# Patient Record
Sex: Female | Born: 1945 | Race: White | Hispanic: No | Marital: Married | Smoking: Former smoker
Health system: Southern US, Community
[De-identification: ages and names within clinical notes are randomized; demographics above are authoritative.]

## PROBLEM LIST (undated history)

## (undated) DIAGNOSIS — E785 Hyperlipidemia, unspecified: Secondary | ICD-10-CM

## (undated) DIAGNOSIS — I10 Essential (primary) hypertension: Secondary | ICD-10-CM

## (undated) DIAGNOSIS — M549 Dorsalgia, unspecified: Secondary | ICD-10-CM

## (undated) DIAGNOSIS — F419 Anxiety disorder, unspecified: Secondary | ICD-10-CM

## (undated) DIAGNOSIS — M1990 Unspecified osteoarthritis, unspecified site: Secondary | ICD-10-CM

## (undated) DIAGNOSIS — G8929 Other chronic pain: Secondary | ICD-10-CM

## (undated) DIAGNOSIS — H3530 Unspecified macular degeneration: Secondary | ICD-10-CM

## (undated) DIAGNOSIS — M542 Cervicalgia: Secondary | ICD-10-CM

## (undated) DIAGNOSIS — K219 Gastro-esophageal reflux disease without esophagitis: Secondary | ICD-10-CM

## (undated) DIAGNOSIS — K5790 Diverticulosis of intestine, part unspecified, without perforation or abscess without bleeding: Secondary | ICD-10-CM

## (undated) DIAGNOSIS — D126 Benign neoplasm of colon, unspecified: Secondary | ICD-10-CM

## (undated) DIAGNOSIS — E039 Hypothyroidism, unspecified: Secondary | ICD-10-CM

## (undated) DIAGNOSIS — R0602 Shortness of breath: Secondary | ICD-10-CM

## (undated) DIAGNOSIS — N61 Mastitis without abscess: Secondary | ICD-10-CM

## (undated) DIAGNOSIS — J189 Pneumonia, unspecified organism: Secondary | ICD-10-CM

## (undated) HISTORY — DX: Mastitis without abscess: N61.0

## (undated) HISTORY — DX: Diverticulosis of intestine, part unspecified, without perforation or abscess without bleeding: K57.90

## (undated) HISTORY — PX: TOTAL HIP ARTHROPLASTY: SHX124

## (undated) HISTORY — DX: Benign neoplasm of colon, unspecified: D12.6

## (undated) HISTORY — DX: Essential (primary) hypertension: I10

## (undated) HISTORY — DX: Hyperlipidemia, unspecified: E78.5

## (undated) HISTORY — PX: BACK SURGERY: SHX140

## (undated) HISTORY — PX: DILATION AND CURETTAGE OF UTERUS: SHX78

## (undated) HISTORY — PX: TUBAL LIGATION: SHX77

## (undated) HISTORY — DX: Hypothyroidism, unspecified: E03.9

---

## 1978-11-10 HISTORY — PX: BREAST BIOPSY: SHX20

## 2005-03-11 DIAGNOSIS — D126 Benign neoplasm of colon, unspecified: Secondary | ICD-10-CM

## 2005-03-11 HISTORY — DX: Benign neoplasm of colon, unspecified: D12.6

## 2008-01-04 ENCOUNTER — Encounter: Admission: RE | Admit: 2008-01-04 | Discharge: 2008-01-04 | Payer: Self-pay | Admitting: Otolaryngology

## 2008-12-19 ENCOUNTER — Encounter: Admission: RE | Admit: 2008-12-19 | Discharge: 2008-12-19 | Payer: Self-pay | Admitting: Family Medicine

## 2010-04-01 ENCOUNTER — Encounter: Payer: Self-pay | Admitting: Family Medicine

## 2010-06-28 ENCOUNTER — Ambulatory Visit
Admission: RE | Admit: 2010-06-28 | Discharge: 2010-06-28 | Disposition: A | Discharge status: Home / Self Care | Payer: No Typology Code available for payment source | Source: Ambulatory Visit | Attending: Neurosurgery | Admitting: Neurosurgery

## 2010-06-28 ENCOUNTER — Other Ambulatory Visit: Payer: Self-pay | Admitting: Neurosurgery

## 2010-06-28 DIAGNOSIS — M549 Dorsalgia, unspecified: Secondary | ICD-10-CM

## 2010-06-28 DIAGNOSIS — M4800 Spinal stenosis, site unspecified: Secondary | ICD-10-CM

## 2010-06-28 DIAGNOSIS — M5410 Radiculopathy, site unspecified: Secondary | ICD-10-CM

## 2010-07-23 ENCOUNTER — Other Ambulatory Visit: Payer: Self-pay | Admitting: Neurosurgery

## 2010-07-23 DIAGNOSIS — M549 Dorsalgia, unspecified: Secondary | ICD-10-CM

## 2010-07-23 DIAGNOSIS — M5410 Radiculopathy, site unspecified: Secondary | ICD-10-CM

## 2010-07-23 DIAGNOSIS — M48061 Spinal stenosis, lumbar region without neurogenic claudication: Secondary | ICD-10-CM

## 2010-07-24 ENCOUNTER — Ambulatory Visit
Admission: RE | Admit: 2010-07-24 | Discharge: 2010-07-24 | Disposition: A | Discharge status: Home / Self Care | Payer: No Typology Code available for payment source | Source: Ambulatory Visit | Attending: Neurosurgery | Admitting: Neurosurgery

## 2010-07-24 DIAGNOSIS — M549 Dorsalgia, unspecified: Secondary | ICD-10-CM

## 2010-07-24 DIAGNOSIS — M5410 Radiculopathy, site unspecified: Secondary | ICD-10-CM

## 2010-07-24 DIAGNOSIS — M48061 Spinal stenosis, lumbar region without neurogenic claudication: Secondary | ICD-10-CM

## 2010-09-03 ENCOUNTER — Other Ambulatory Visit: Payer: Self-pay | Admitting: Sports Medicine

## 2010-09-03 DIAGNOSIS — M549 Dorsalgia, unspecified: Secondary | ICD-10-CM

## 2010-09-03 DIAGNOSIS — M5410 Radiculopathy, site unspecified: Secondary | ICD-10-CM

## 2010-09-04 ENCOUNTER — Ambulatory Visit
Admission: RE | Admit: 2010-09-04 | Discharge: 2010-09-04 | Disposition: A | Discharge status: Home / Self Care | Payer: No Typology Code available for payment source | Source: Ambulatory Visit | Attending: Sports Medicine | Admitting: Sports Medicine

## 2010-09-04 DIAGNOSIS — M549 Dorsalgia, unspecified: Secondary | ICD-10-CM

## 2010-09-04 DIAGNOSIS — M5410 Radiculopathy, site unspecified: Secondary | ICD-10-CM

## 2010-10-09 ENCOUNTER — Other Ambulatory Visit: Payer: Self-pay | Admitting: Neurosurgery

## 2010-10-09 DIAGNOSIS — M542 Cervicalgia: Secondary | ICD-10-CM

## 2010-10-09 DIAGNOSIS — M5410 Radiculopathy, site unspecified: Secondary | ICD-10-CM

## 2010-10-09 DIAGNOSIS — M549 Dorsalgia, unspecified: Secondary | ICD-10-CM

## 2010-10-10 MED ORDER — DIAZEPAM 2 MG PO TABS: 10.0000 mg | ORAL_TABLET | Freq: Once | ORAL | Status: DC

## 2010-10-11 ENCOUNTER — Ambulatory Visit
Admission: RE | Admit: 2010-10-11 | Discharge: 2010-10-11 | Disposition: A | Discharge status: Home / Self Care | Payer: Medicare Other | Source: Ambulatory Visit | Attending: Neurosurgery | Admitting: Neurosurgery

## 2010-10-11 ENCOUNTER — Other Ambulatory Visit: Payer: No Typology Code available for payment source

## 2010-10-11 DIAGNOSIS — M549 Dorsalgia, unspecified: Secondary | ICD-10-CM

## 2010-10-11 DIAGNOSIS — M542 Cervicalgia: Secondary | ICD-10-CM

## 2010-10-11 DIAGNOSIS — M5410 Radiculopathy, site unspecified: Secondary | ICD-10-CM

## 2010-10-11 MED ORDER — IOHEXOL 300 MG/ML  SOLN
10.0000 mL | Freq: Once | INTRAMUSCULAR | Status: AC | PRN
  Administered 2010-10-11: 10 mL via INTRATHECAL

## 2010-10-11 MED ORDER — SODIUM CHLORIDE 0.9 % IV SOLN: 4.0000 mg | Freq: Four times a day (QID) | INTRAVENOUS | Status: DC | PRN

## 2010-10-11 MED ORDER — MEPERIDINE HCL 100 MG/ML IJ SOLN
75.0000 mg | Freq: Once | INTRAMUSCULAR | Status: AC
  Administered 2010-10-11: 75 mg via INTRAMUSCULAR

## 2010-10-11 MED ORDER — DIAZEPAM 2 MG PO TABS
5.0000 mg | ORAL_TABLET | Freq: Once | ORAL | Status: AC
  Administered 2010-10-11: 10 mg via ORAL

## 2010-10-11 MED ORDER — ONDANSETRON HCL 4 MG/2ML IJ SOLN
4.0000 mg | Freq: Once | INTRAMUSCULAR | Status: AC
  Administered 2010-10-11: 4 mg via INTRAMUSCULAR

## 2010-10-19 ENCOUNTER — Other Ambulatory Visit: Payer: Self-pay | Admitting: Neurosurgery

## 2010-10-19 DIAGNOSIS — Z780 Asymptomatic menopausal state: Secondary | ICD-10-CM

## 2010-10-23 ENCOUNTER — Ambulatory Visit
Admission: RE | Admit: 2010-10-23 | Discharge: 2010-10-23 | Disposition: A | Discharge status: Home / Self Care | Payer: Medicare Other | Source: Ambulatory Visit | Attending: Neurosurgery | Admitting: Neurosurgery

## 2010-10-23 DIAGNOSIS — Z780 Asymptomatic menopausal state: Secondary | ICD-10-CM

## 2010-12-27 ENCOUNTER — Encounter: Payer: Self-pay | Admitting: Gastroenterology

## 2010-12-27 ENCOUNTER — Ambulatory Visit (INDEPENDENT_AMBULATORY_CARE_PROVIDER_SITE_OTHER): Payer: Medicare Other | Admitting: Gastroenterology

## 2010-12-27 VITALS — BP 128/76 | HR 72 | Ht 64.0 in | Wt 157.2 lb

## 2010-12-27 DIAGNOSIS — Z860100 Personal history of colon polyps, unspecified: Secondary | ICD-10-CM | POA: Insufficient documentation

## 2010-12-27 DIAGNOSIS — K5900 Constipation, unspecified: Secondary | ICD-10-CM

## 2010-12-27 MED ORDER — PEG-KCL-NACL-NASULF-NA ASC-C 100 G PO SOLR: 1.0000 | Freq: Once | ORAL | Status: DC

## 2010-12-27 NOTE — Patient Instructions (Signed)
You have been scheduled for a Colonoscopy with propofol sedation.  See separate instructions.   Pick up your prep kit from your pharmacy.   cc: Zoe Lan, NP

## 2010-12-27 NOTE — Progress Notes (Signed)
History of Present Illness: This is a 65 year old female who complains of a mild chronic constipation she feels is related to her medications. She takes a fiber, laxative combination which is effective in controlling her constipation. She had colon polyps on colonoscopy in 2007 in [place] and was recommended for a 5 year followup colonoscopy. Apparently diverticulosis was also found. I do not have the procedure report or the pathology report available. Denies weight loss, abdominal pain, diarrhea, change in stool caliber, melena, hematochezia, nausea, vomiting, dysphagia, reflux symptoms, chest pain.  Past Medical History  Diagnosis Date  . Hyperlipidemia   . Hypertension   . Asthma   . Overactive bladder   . Osteoporosis   . Allergic rhinitis   . Hypothyroidism   . Diverticulosis   . Cellulitis of breast   . Adenomatous colon polyp 2007   Past Surgical History  Procedure Date  . Breast biopsy 1980's    Benign Right  . Total hip arthroplasty     x2    reports that she quit smoking about 19 years ago. She has never used smokeless tobacco. She reports that she does not drink alcohol or use illicit drugs. family history includes Cancer in her maternal grandmother; Diabetes in her son; Heart failure in her mother; Hyperlipidemia in her son; Hypertension in her brother; Kidney failure in her father; and Prostate cancer in her paternal grandfather. No Known Allergies   Review of Systems: Pertinent positive and negative review of systems were noted in the above HPI section. All other review of systems were otherwise negative.  Physical Exam: General: Well developed , well nourished, no acute distress Head: Normocephalic and atraumatic Eyes:  sclerae anicteric, EOMI Ears: Normal auditory acuity Mouth: No deformity or lesions Neck: Supple, no masses or thyromegaly Lungs: Clear throughout to auscultation Heart: Regular rate and rhythm; no murmurs, rubs or bruits Abdomen: Soft, non  tender and non distended. No masses, hepatosplenomegaly or hernias noted. Normal Bowel sounds Rectal: Deferred to colonoscopy Musculoskeletal: Symmetrical with no gross deformities  Skin: No lesions on visible extremities Pulses:  Normal pulses noted Extremities: No clubbing, cyanosis, edema or deformities noted Neurological: Alert oriented x 4, grossly nonfocal Cervical Nodes:  No significant cervical adenopathy Inguinal Nodes: No significant inguinal adenopathy Psychological:  Alert and cooperative. Normal mood and affect  Assessment and Recommendations:  1. Personal history of colon polyps. Presumably these were adenomatous based on prior surveillance recommendations. She is due for surveillance colonoscopy. The risks, benefits, and alternatives to colonoscopy with possible biopsy and possible polypectomy were discussed with the patient and they consent to proceed. Given her medications will proceed with propofol sedation.  2. Constipation. Continue current laxative and fiber regimen.

## 2010-12-31 ENCOUNTER — Telehealth: Payer: Self-pay | Admitting: Gastroenterology

## 2010-12-31 NOTE — Telephone Encounter (Signed)
Faxed release to try to get last Colonoscopy from Dr. Laveda Norman.

## 2011-01-29 ENCOUNTER — Ambulatory Visit (AMBULATORY_SURGERY_CENTER): Payer: Medicare Other | Admitting: Gastroenterology

## 2011-01-29 ENCOUNTER — Encounter: Payer: Self-pay | Admitting: Gastroenterology

## 2011-01-29 DIAGNOSIS — Z1211 Encounter for screening for malignant neoplasm of colon: Secondary | ICD-10-CM

## 2011-01-29 DIAGNOSIS — Z86010 Personal history of colonic polyps: Secondary | ICD-10-CM

## 2011-01-29 MED ORDER — SODIUM CHLORIDE 0.9 % IV SOLN: 500.0000 mL | INTRAVENOUS | Status: DC

## 2011-01-29 NOTE — Patient Instructions (Signed)
Please refer to the blue and neon green sheets for instructions regarding diet and activity for the rest of today.  Handouts on diverticulosis and a high fiber diet given. Resume previous medications. 

## 2011-01-29 NOTE — Progress Notes (Signed)
Pressure applied to abdomen to reach cecum.  

## 2011-01-29 NOTE — Progress Notes (Signed)
Patient did not experience any of the following events: a burn prior to discharge; a fall within the facility; wrong site/side/patient/procedure/implant event; or a hospital transfer or hospital admission upon discharge from the facility. (G8907) Patient did not have preoperative order for IV antibiotic SSI prophylaxis. (G8918)  

## 2011-01-30 ENCOUNTER — Telehealth: Payer: Self-pay

## 2011-01-30 NOTE — Telephone Encounter (Signed)

## 2011-04-26 ENCOUNTER — Ambulatory Visit
Admission: RE | Admit: 2011-04-26 | Discharge: 2011-04-26 | Disposition: A | Discharge status: Home / Self Care | Payer: Medicare Other | Source: Ambulatory Visit | Attending: Allergy and Immunology | Admitting: Allergy and Immunology

## 2011-04-26 ENCOUNTER — Other Ambulatory Visit: Payer: Self-pay | Admitting: Allergy and Immunology

## 2011-04-26 DIAGNOSIS — J329 Chronic sinusitis, unspecified: Secondary | ICD-10-CM

## 2011-04-26 DIAGNOSIS — R05 Cough: Secondary | ICD-10-CM

## 2012-02-25 ENCOUNTER — Other Ambulatory Visit: Payer: Self-pay | Admitting: Neurosurgery

## 2012-02-25 DIAGNOSIS — M549 Dorsalgia, unspecified: Secondary | ICD-10-CM

## 2012-02-25 DIAGNOSIS — M5410 Radiculopathy, site unspecified: Secondary | ICD-10-CM

## 2012-03-02 ENCOUNTER — Ambulatory Visit
Admission: RE | Admit: 2012-03-02 | Discharge: 2012-03-02 | Disposition: A | Discharge status: Home / Self Care | Payer: Medicare Other | Source: Ambulatory Visit | Attending: Neurosurgery | Admitting: Neurosurgery

## 2012-03-02 VITALS — BP 140/75 | HR 58

## 2012-03-02 DIAGNOSIS — M549 Dorsalgia, unspecified: Secondary | ICD-10-CM

## 2012-03-02 DIAGNOSIS — M5410 Radiculopathy, site unspecified: Secondary | ICD-10-CM

## 2012-03-02 MED ORDER — IOHEXOL 180 MG/ML  SOLN
15.0000 mL | Freq: Once | INTRAMUSCULAR | Status: AC | PRN
  Administered 2012-03-02: 15 mL via INTRATHECAL

## 2012-03-02 MED ORDER — HYDROCODONE-ACETAMINOPHEN 5-325 MG PO TABS
1.0000 | ORAL_TABLET | Freq: Once | ORAL | Status: AC
  Administered 2012-03-02: 1 via ORAL

## 2012-03-02 MED ORDER — DIAZEPAM 5 MG PO TABS
5.0000 mg | ORAL_TABLET | Freq: Once | ORAL | Status: AC
  Administered 2012-03-02: 5 mg via ORAL

## 2012-03-02 NOTE — Progress Notes (Signed)
Pt states she has been off cymbalta for the past 2 days.

## 2012-03-12 ENCOUNTER — Other Ambulatory Visit: Payer: Self-pay | Admitting: Neurosurgery

## 2012-03-27 ENCOUNTER — Encounter (HOSPITAL_COMMUNITY): Payer: Self-pay

## 2012-03-30 ENCOUNTER — Encounter (HOSPITAL_COMMUNITY)
Admission: RE | Admit: 2012-03-30 | Discharge: 2012-03-30 | Disposition: A | Discharge status: Home / Self Care | Payer: Medicare Other | Source: Ambulatory Visit | Attending: Neurosurgery | Admitting: Neurosurgery

## 2012-03-30 ENCOUNTER — Encounter (HOSPITAL_COMMUNITY): Payer: Self-pay

## 2012-03-30 HISTORY — DX: Cervicalgia: M54.2

## 2012-03-30 HISTORY — DX: Unspecified macular degeneration: H35.30

## 2012-03-30 HISTORY — DX: Anxiety disorder, unspecified: F41.9

## 2012-03-30 HISTORY — DX: Unspecified osteoarthritis, unspecified site: M19.90

## 2012-03-30 HISTORY — DX: Other chronic pain: G89.29

## 2012-03-30 HISTORY — DX: Dorsalgia, unspecified: M54.9

## 2012-03-30 HISTORY — DX: Pneumonia, unspecified organism: J18.9

## 2012-03-30 LAB — BASIC METABOLIC PANEL
CO2: 30 mEq/L (ref 19–32)
Calcium: 9.9 mg/dL (ref 8.4–10.5)
GFR calc Af Amer: >90 mL/min (ref >90)
GFR calc non Af Amer: 89 mL/min — ABNORMAL LOW (ref >90)
Sodium: 136 mEq/L (ref 135–145)

## 2012-03-30 LAB — CBC
Platelets: 235 10*3/uL (ref 150–400)
RBC: 4.74 MIL/uL (ref 3.87–5.11)
WBC: 7 10*3/uL (ref 4.0–10.5)

## 2012-03-30 LAB — TYPE AND SCREEN
ABO/RH(D): A POS
Antibody Screen: NEGATIVE

## 2012-03-30 LAB — SURGICAL PCR SCREEN: MRSA, PCR: NEGATIVE

## 2012-03-30 NOTE — Pre-Procedure Instructions (Signed)
Shelby Bowman  03/30/2012   Your procedure is scheduled on:  Tuesday April 07, 2012  Report to [facility] at 5:30 AM.  Call this number if you have problems the morning of surgery: [phone]   Remember:   Do not eat food or drink liquids after midnight.   Take these medicines the morning of surgery with A SIP OF WATER: albuterol, fosamaxk, qvar, cymbalta, fentanyl patch, flonase, synthroid, dulera, singulair, protonix   Do not wear jewelry, make-up or nail polish.  Do not wear lotions, powders, or perfumes.   Do not shave 48 hours prior to surgery.  Do not bring valuables to the hospital.  Contacts, dentures or bridgework may not be worn into surgery.  Leave suitcase in the car. After surgery it may be brought to your room.  For patients admitted to the hospital, checkout time is 11:00 AM the day of  discharge.   Patients discharged the day of surgery will not be allowed to drive  home.  Name and phone number of your driver: family / friend  Special Instructions: Shower using CHG 2 nights before surgery and the night before surgery.  If you shower the day of surgery use CHG.  Use special wash - you have one bottle of CHG for all showers.  You should use approximately 1/3 of the bottle for each shower.   Please read over the following fact sheets that you were given: Pain Booklet, Coughing and Deep Breathing, Blood Transfusion Information, MRSA Information and Surgical Site Infection Prevention

## 2012-04-06 MED ORDER — CEFAZOLIN SODIUM-DEXTROSE 2-3 GM-% IV SOLR
2.0000 g | INTRAVENOUS | Status: AC
  Administered 2012-04-07: 2 g via INTRAVENOUS
  Filled 2012-04-06: qty 50

## 2012-04-07 ENCOUNTER — Inpatient Hospital Stay (HOSPITAL_COMMUNITY): Payer: Medicare Other

## 2012-04-07 ENCOUNTER — Inpatient Hospital Stay (HOSPITAL_COMMUNITY): Payer: Medicare Other | Admitting: Certified Registered"

## 2012-04-07 ENCOUNTER — Encounter (HOSPITAL_COMMUNITY): Payer: Self-pay | Admitting: Surgery

## 2012-04-07 ENCOUNTER — Encounter (HOSPITAL_COMMUNITY): Payer: Self-pay | Admitting: *Deleted

## 2012-04-07 ENCOUNTER — Inpatient Hospital Stay (HOSPITAL_COMMUNITY)
Admission: RE | Admit: 2012-04-07 | Discharge: 2012-04-15 | DRG: 458 | Disposition: A | Discharge status: Home / Self Care | Payer: Medicare Other | Source: Ambulatory Visit | Attending: Neurosurgery | Admitting: Neurosurgery

## 2012-04-07 ENCOUNTER — Encounter (HOSPITAL_COMMUNITY)
Admission: RE | Disposition: A | Discharge status: Home / Self Care | Payer: Self-pay | Source: Ambulatory Visit | Attending: Neurosurgery

## 2012-04-07 ENCOUNTER — Encounter (HOSPITAL_COMMUNITY): Payer: Self-pay | Admitting: Certified Registered"

## 2012-04-07 DIAGNOSIS — I10 Essential (primary) hypertension: Secondary | ICD-10-CM | POA: Diagnosis present

## 2012-04-07 DIAGNOSIS — M4120 Other idiopathic scoliosis, site unspecified: Principal | ICD-10-CM | POA: Diagnosis present

## 2012-04-07 DIAGNOSIS — M51379 Other intervertebral disc degeneration, lumbosacral region without mention of lumbar back pain or lower extremity pain: Secondary | ICD-10-CM | POA: Diagnosis present

## 2012-04-07 DIAGNOSIS — F411 Generalized anxiety disorder: Secondary | ICD-10-CM | POA: Diagnosis present

## 2012-04-07 DIAGNOSIS — E039 Hypothyroidism, unspecified: Secondary | ICD-10-CM | POA: Diagnosis present

## 2012-04-07 DIAGNOSIS — M5137 Other intervertebral disc degeneration, lumbosacral region: Secondary | ICD-10-CM | POA: Diagnosis present

## 2012-04-07 DIAGNOSIS — Z87891 Personal history of nicotine dependence: Secondary | ICD-10-CM

## 2012-04-07 DIAGNOSIS — J45909 Unspecified asthma, uncomplicated: Secondary | ICD-10-CM | POA: Diagnosis present

## 2012-04-07 DIAGNOSIS — M419 Scoliosis, unspecified: Secondary | ICD-10-CM | POA: Diagnosis present

## 2012-04-07 DIAGNOSIS — K5900 Constipation, unspecified: Secondary | ICD-10-CM | POA: Diagnosis present

## 2012-04-07 HISTORY — PX: POSTERIOR LUMBAR FUSION 4 LEVEL: SHX6037

## 2012-04-07 SURGERY — POSTERIOR LUMBAR FUSION 4 LEVEL
Anesthesia: General | Site: Back | Wound class: Clean

## 2012-04-07 MED ORDER — THROMBIN 20000 UNITS EX SOLR
CUTANEOUS | Status: DC | PRN
  Administered 2012-04-07: 15:00:00 via TOPICAL

## 2012-04-07 MED ORDER — DULOXETINE HCL 30 MG PO CPEP
30.0000 mg | ORAL_CAPSULE | Freq: Every day | ORAL | Status: DC
  Administered 2012-04-08 – 2012-04-15 (×8): 30 mg via ORAL
  Filled 2012-04-07 (×8): qty 1

## 2012-04-07 MED ORDER — ACETAMINOPHEN 650 MG RE SUPP: 650.0000 mg | RECTAL | Status: DC | PRN

## 2012-04-07 MED ORDER — MONTELUKAST SODIUM 10 MG PO TABS
10.0000 mg | ORAL_TABLET | Freq: Every day | ORAL | Status: DC
  Administered 2012-04-07 – 2012-04-14 (×7): 10 mg via ORAL
  Filled 2012-04-07 (×10): qty 1

## 2012-04-07 MED ORDER — SODIUM CHLORIDE 0.9 % IV SOLN
INTRAVENOUS | Status: DC | PRN
  Administered 2012-04-07: 17:00:00 via INTRAVENOUS

## 2012-04-07 MED ORDER — ROCURONIUM BROMIDE 100 MG/10ML IV SOLN
INTRAVENOUS | Status: DC | PRN
  Administered 2012-04-07 (×4): 10 mg via INTRAVENOUS
  Administered 2012-04-07: 50 mg via INTRAVENOUS

## 2012-04-07 MED ORDER — DIPHENHYDRAMINE HCL 50 MG/ML IJ SOLN: 12.5000 mg | Freq: Four times a day (QID) | INTRAMUSCULAR | Status: DC | PRN

## 2012-04-07 MED ORDER — MOMETASONE FURO-FORMOTEROL FUM 200-5 MCG/ACT IN AERO
2.0000 | INHALATION_SPRAY | Freq: Two times a day (BID) | RESPIRATORY_TRACT | Status: DC
  Administered 2012-04-08 – 2012-04-15 (×13): 2 via RESPIRATORY_TRACT
  Filled 2012-04-07 (×3): qty 8.8

## 2012-04-07 MED ORDER — FENTANYL CITRATE 0.05 MG/ML IJ SOLN
INTRAMUSCULAR | Status: DC | PRN
  Administered 2012-04-07 (×3): 100 ug via INTRAVENOUS
  Administered 2012-04-07: 50 ug via INTRAVENOUS
  Administered 2012-04-07: 100 ug via INTRAVENOUS
  Administered 2012-04-07: 50 ug via INTRAVENOUS

## 2012-04-07 MED ORDER — LOSARTAN POTASSIUM-HCTZ 50-12.5 MG PO TABS: 1.0000 | ORAL_TABLET | Freq: Every day | ORAL | Status: DC

## 2012-04-07 MED ORDER — MIDAZOLAM HCL 5 MG/5ML IJ SOLN
INTRAMUSCULAR | Status: DC | PRN
  Administered 2012-04-07: 2 mg via INTRAVENOUS

## 2012-04-07 MED ORDER — BUPIVACAINE LIPOSOME 1.3 % IJ SUSP
20.0000 mL | INTRAMUSCULAR | Status: AC
  Filled 2012-04-07: qty 20

## 2012-04-07 MED ORDER — SODIUM CHLORIDE 0.9 % IJ SOLN: 9.0000 mL | INTRAMUSCULAR | Status: DC | PRN

## 2012-04-07 MED ORDER — OXYCODONE-ACETAMINOPHEN 5-325 MG PO TABS
1.0000 | ORAL_TABLET | ORAL | Status: DC | PRN
  Administered 2012-04-09 – 2012-04-10 (×2): 2 via ORAL
  Filled 2012-04-07 (×2): qty 2

## 2012-04-07 MED ORDER — ONDANSETRON HCL 4 MG/2ML IJ SOLN: 4.0000 mg | Freq: Four times a day (QID) | INTRAMUSCULAR | Status: DC | PRN

## 2012-04-07 MED ORDER — CEFAZOLIN SODIUM 1-5 GM-% IV SOLN
1.0000 g | Freq: Three times a day (TID) | INTRAVENOUS | Status: AC
  Administered 2012-04-07 – 2012-04-08 (×2): 1 g via INTRAVENOUS
  Filled 2012-04-07 (×2): qty 50

## 2012-04-07 MED ORDER — ALENDRONATE SODIUM 70 MG PO TABS: 70.0000 mg | ORAL_TABLET | ORAL | Status: DC

## 2012-04-07 MED ORDER — SODIUM CHLORIDE 0.9 % IJ SOLN
3.0000 mL | Freq: Two times a day (BID) | INTRAMUSCULAR | Status: DC
  Administered 2012-04-08 – 2012-04-12 (×8): 3 mL via INTRAVENOUS

## 2012-04-07 MED ORDER — LIDOCAINE HCL (CARDIAC) 20 MG/ML IV SOLN
INTRAVENOUS | Status: DC | PRN
  Administered 2012-04-07: 70 mg via INTRAVENOUS

## 2012-04-07 MED ORDER — PROPOFOL 10 MG/ML IV BOLUS
INTRAVENOUS | Status: DC | PRN
  Administered 2012-04-07: 125 mg via INTRAVENOUS

## 2012-04-07 MED ORDER — LACTATED RINGERS IV SOLN
INTRAVENOUS | Status: DC | PRN
  Administered 2012-04-07 (×4): via INTRAVENOUS

## 2012-04-07 MED ORDER — EPHEDRINE SULFATE 50 MG/ML IJ SOLN
INTRAMUSCULAR | Status: DC | PRN
  Administered 2012-04-07 (×2): 5 mg via INTRAVENOUS
  Administered 2012-04-07: 10 mg via INTRAVENOUS
  Administered 2012-04-07 (×6): 5 mg via INTRAVENOUS

## 2012-04-07 MED ORDER — OXYCODONE HCL 5 MG/5ML PO SOLN: 5.0000 mg | Freq: Once | ORAL | Status: DC | PRN

## 2012-04-07 MED ORDER — LORATADINE 10 MG PO TABS
10.0000 mg | ORAL_TABLET | Freq: Every day | ORAL | Status: DC
  Administered 2012-04-07 – 2012-04-15 (×9): 10 mg via ORAL
  Filled 2012-04-07 (×9): qty 1

## 2012-04-07 MED ORDER — HYDROMORPHONE HCL PF 1 MG/ML IJ SOLN
INTRAMUSCULAR | Status: AC
  Filled 2012-04-07: qty 1

## 2012-04-07 MED ORDER — SODIUM CHLORIDE 0.9 % IJ SOLN: 3.0000 mL | INTRAMUSCULAR | Status: DC | PRN

## 2012-04-07 MED ORDER — LOSARTAN POTASSIUM 50 MG PO TABS
50.0000 mg | ORAL_TABLET | Freq: Every day | ORAL | Status: DC
  Administered 2012-04-07 – 2012-04-15 (×7): 50 mg via ORAL
  Filled 2012-04-07 (×9): qty 1

## 2012-04-07 MED ORDER — BUPIVACAINE LIPOSOME 1.3 % IJ SUSP
INTRAMUSCULAR | Status: DC | PRN
  Administered 2012-04-07: 20 mL

## 2012-04-07 MED ORDER — SODIUM CHLORIDE 0.9 % IV SOLN: 250.0000 mL | INTRAVENOUS | Status: DC

## 2012-04-07 MED ORDER — ONDANSETRON HCL 4 MG/2ML IJ SOLN
4.0000 mg | INTRAMUSCULAR | Status: DC | PRN
  Administered 2012-04-08: 4 mg via INTRAVENOUS
  Filled 2012-04-07: qty 2

## 2012-04-07 MED ORDER — NALOXONE HCL 0.4 MG/ML IJ SOLN: 0.4000 mg | INTRAMUSCULAR | Status: DC | PRN

## 2012-04-07 MED ORDER — LEVOTHYROXINE SODIUM 75 MCG PO TABS
75.0000 ug | ORAL_TABLET | Freq: Every day | ORAL | Status: DC
  Administered 2012-04-08 – 2012-04-15 (×8): 75 ug via ORAL
  Filled 2012-04-07 (×10): qty 1

## 2012-04-07 MED ORDER — MORPHINE SULFATE (PF) 1 MG/ML IV SOLN
INTRAVENOUS | Status: DC
  Administered 2012-04-07: 21:00:00 via INTRAVENOUS
  Administered 2012-04-08: 4.5 mg via INTRAVENOUS
  Administered 2012-04-08: 15.2 mg via INTRAVENOUS
  Administered 2012-04-08: 20.5 mg via INTRAVENOUS
  Administered 2012-04-08: 13.5 mg via INTRAVENOUS
  Administered 2012-04-08: 12:00:00 via INTRAVENOUS
  Administered 2012-04-08: 10.5 mg via INTRAVENOUS
  Administered 2012-04-09: 1.5 mg via INTRAVENOUS
  Administered 2012-04-09: 5.61 mg via INTRAVENOUS
  Administered 2012-04-09: 1.5 mg via INTRAVENOUS
  Administered 2012-04-09: 9 mg via INTRAVENOUS
  Administered 2012-04-10: 1.5 mg via INTRAVENOUS
  Administered 2012-04-10: 11:00:00 via INTRAVENOUS
  Administered 2012-04-10: 1.5 mg via INTRAVENOUS
  Administered 2012-04-10: 3 mg via INTRAVENOUS
  Filled 2012-04-07 (×4): qty 25

## 2012-04-07 MED ORDER — ALBUMIN HUMAN 5 % IV SOLN
INTRAVENOUS | Status: DC | PRN
  Administered 2012-04-07 (×2): via INTRAVENOUS

## 2012-04-07 MED ORDER — HYDROMORPHONE HCL PF 1 MG/ML IJ SOLN
0.2500 mg | INTRAMUSCULAR | Status: DC | PRN
  Administered 2012-04-07 (×4): 0.5 mg via INTRAVENOUS

## 2012-04-07 MED ORDER — ONDANSETRON HCL 4 MG/2ML IJ SOLN
INTRAMUSCULAR | Status: DC | PRN
  Administered 2012-04-07: 4 mg via INTRAVENOUS

## 2012-04-07 MED ORDER — LEVOCETIRIZINE DIHYDROCHLORIDE 5 MG PO TABS: 5.0000 mg | ORAL_TABLET | Freq: Every evening | ORAL | Status: DC

## 2012-04-07 MED ORDER — DIPHENHYDRAMINE HCL 12.5 MG/5ML PO ELIX: 12.5000 mg | ORAL_SOLUTION | Freq: Four times a day (QID) | ORAL | Status: DC | PRN

## 2012-04-07 MED ORDER — LABETALOL HCL 5 MG/ML IV SOLN
INTRAVENOUS | Status: DC | PRN
  Administered 2012-04-07 (×4): 5 mg via INTRAVENOUS

## 2012-04-07 MED ORDER — PHENYLEPHRINE HCL 10 MG/ML IJ SOLN
INTRAMUSCULAR | Status: DC | PRN
  Administered 2012-04-07: 40 ug via INTRAVENOUS
  Administered 2012-04-07: 80 ug via INTRAVENOUS
  Administered 2012-04-07 (×2): 40 ug via INTRAVENOUS
  Administered 2012-04-07 (×3): 80 ug via INTRAVENOUS
  Administered 2012-04-07 (×2): 40 ug via INTRAVENOUS

## 2012-04-07 MED ORDER — MENTHOL 3 MG MT LOZG: 1.0000 | LOZENGE | OROMUCOSAL | Status: DC | PRN

## 2012-04-07 MED ORDER — MORPHINE SULFATE (PF) 1 MG/ML IV SOLN
INTRAVENOUS | Status: AC
  Administered 2012-04-08: 25 mg
  Filled 2012-04-07: qty 25

## 2012-04-07 MED ORDER — 0.9 % SODIUM CHLORIDE (POUR BTL) OPTIME
TOPICAL | Status: DC | PRN
  Administered 2012-04-07: 1000 mL

## 2012-04-07 MED ORDER — ACETAMINOPHEN 325 MG PO TABS
650.0000 mg | ORAL_TABLET | ORAL | Status: DC | PRN
  Administered 2012-04-10: 650 mg via ORAL
  Filled 2012-04-07: qty 2

## 2012-04-07 MED ORDER — NEOSTIGMINE METHYLSULFATE 1 MG/ML IJ SOLN
INTRAMUSCULAR | Status: DC | PRN
  Administered 2012-04-07: 4 mg via INTRAVENOUS

## 2012-04-07 MED ORDER — OXYCODONE HCL 5 MG PO TABS: 5.0000 mg | ORAL_TABLET | Freq: Once | ORAL | Status: DC | PRN

## 2012-04-07 MED ORDER — SIMVASTATIN 5 MG PO TABS
5.0000 mg | ORAL_TABLET | Freq: Every day | ORAL | Status: DC
  Administered 2012-04-07 – 2012-04-14 (×8): 5 mg via ORAL
  Filled 2012-04-07 (×9): qty 1

## 2012-04-07 MED ORDER — HEMOSTATIC AGENTS (NO CHARGE) OPTIME
TOPICAL | Status: DC | PRN
  Administered 2012-04-07: 1 via TOPICAL

## 2012-04-07 MED ORDER — DIAZEPAM 5 MG PO TABS
5.0000 mg | ORAL_TABLET | Freq: Four times a day (QID) | ORAL | Status: DC | PRN
  Administered 2012-04-08 – 2012-04-12 (×8): 5 mg via ORAL
  Filled 2012-04-07 (×8): qty 1

## 2012-04-07 MED ORDER — SODIUM CHLORIDE 0.9 % IV SOLN: INTRAVENOUS | Status: DC

## 2012-04-07 MED ORDER — PHENOL 1.4 % MT LIQD: 1.0000 | OROMUCOSAL | Status: DC | PRN

## 2012-04-07 MED ORDER — HYDROCHLOROTHIAZIDE 12.5 MG PO CAPS
12.5000 mg | ORAL_CAPSULE | Freq: Every day | ORAL | Status: DC
  Administered 2012-04-07 – 2012-04-15 (×6): 12.5 mg via ORAL
  Filled 2012-04-07 (×9): qty 1

## 2012-04-07 MED ORDER — GLYCOPYRROLATE 0.2 MG/ML IJ SOLN
INTRAMUSCULAR | Status: DC | PRN
  Administered 2012-04-07: .6 mg via INTRAVENOUS

## 2012-04-07 MED ORDER — ZOLPIDEM TARTRATE 5 MG PO TABS
5.0000 mg | ORAL_TABLET | Freq: Every evening | ORAL | Status: DC | PRN
  Administered 2012-04-13 – 2012-04-15 (×2): 5 mg via ORAL
  Filled 2012-04-07 (×2): qty 1

## 2012-04-07 MED ORDER — HYDROCHLOROTHIAZIDE 25 MG PO TABS
25.0000 mg | ORAL_TABLET | Freq: Every day | ORAL | Status: DC
  Administered 2012-04-07 – 2012-04-15 (×8): 25 mg via ORAL
  Filled 2012-04-07 (×9): qty 1

## 2012-04-07 SURGICAL SUPPLY — 70 items
BENZOIN TINCTURE PRP APPL 2/3 (GAUZE/BANDAGES/DRESSINGS) ×2 IMPLANT
BLADE SURG ROTATE 9660 (MISCELLANEOUS) IMPLANT
BONE EQUIVA 10CC (Bone Implant) ×2 IMPLANT
BUR ACORN 6.0 (BURR) ×2 IMPLANT
BUR MATCHSTICK NEURO 3.0 LAGG (BURR) ×4 IMPLANT
CANISTER SUCTION 2500CC (MISCELLANEOUS) ×2 IMPLANT
CAP REVERE LOCKING (Cap) ×20 IMPLANT
CLOTH BEACON ORANGE TIMEOUT ST (SAFETY) ×2 IMPLANT
CONNECTOR REVERE 6.35 32-40MM (Connector) ×2 IMPLANT
CONT SPEC 4OZ CLIKSEAL STRL BL (MISCELLANEOUS) ×4 IMPLANT
COVER BACK TABLE 24X17X13 BIG (DRAPES) IMPLANT
COVER TABLE BACK 60X90 (DRAPES) ×2 IMPLANT
DRAPE C-ARM 42X72 X-RAY (DRAPES) ×4 IMPLANT
DRAPE LAPAROTOMY 100X72X124 (DRAPES) ×2 IMPLANT
DRAPE POUCH INSTRU U-SHP 10X18 (DRAPES) ×2 IMPLANT
DRAPE PROXIMA HALF (DRAPES) ×2 IMPLANT
DRSG PAD ABDOMINAL 8X10 ST (GAUZE/BANDAGES/DRESSINGS) IMPLANT
DURAPREP 26ML APPLICATOR (WOUND CARE) ×2 IMPLANT
ELECT REM PT RETURN 9FT ADLT (ELECTROSURGICAL) ×2
ELECTRODE REM PT RTRN 9FT ADLT (ELECTROSURGICAL) ×1 IMPLANT
EVACUATOR 1/8 PVC DRAIN (DRAIN) IMPLANT
EVACUATOR 3/16  PVC DRAIN (DRAIN) ×1
EVACUATOR 3/16 PVC DRAIN (DRAIN) ×1 IMPLANT
GAUZE SPONGE 4X4 16PLY XRAY LF (GAUZE/BANDAGES/DRESSINGS) ×2 IMPLANT
GLOVE BIO SURGEON STRL SZ 6.5 (GLOVE) ×10 IMPLANT
GLOVE BIOGEL M 8.0 STRL (GLOVE) ×6 IMPLANT
GLOVE BIOGEL PI IND STRL 7.0 (GLOVE) ×1 IMPLANT
GLOVE BIOGEL PI INDICATOR 7.0 (GLOVE) ×1
GLOVE ECLIPSE 7.5 STRL STRAW (GLOVE) ×2 IMPLANT
GLOVE EXAM NITRILE LRG STRL (GLOVE) IMPLANT
GLOVE EXAM NITRILE MD LF STRL (GLOVE) ×2 IMPLANT
GLOVE EXAM NITRILE XL STR (GLOVE) IMPLANT
GLOVE EXAM NITRILE XS STR PU (GLOVE) IMPLANT
GOWN BRE IMP SLV AUR LG STRL (GOWN DISPOSABLE) ×8 IMPLANT
GOWN BRE IMP SLV AUR XL STRL (GOWN DISPOSABLE) IMPLANT
GOWN STRL REIN 2XL LVL4 (GOWN DISPOSABLE) ×2 IMPLANT
KIT BASIN OR (CUSTOM PROCEDURE TRAY) ×2 IMPLANT
KIT ROOM TURNOVER OR (KITS) ×2 IMPLANT
MILL MEDIUM DISP (BLADE) ×2 IMPLANT
NEEDLE HYPO 18GX1.5 BLUNT FILL (NEEDLE) IMPLANT
NEEDLE HYPO 21X1.5 SAFETY (NEEDLE) ×2 IMPLANT
NEEDLE HYPO 25X1 1.5 SAFETY (NEEDLE) ×2 IMPLANT
NS IRRIG 1000ML POUR BTL (IV SOLUTION) ×2 IMPLANT
PACK LAMINECTOMY NEURO (CUSTOM PROCEDURE TRAY) ×2 IMPLANT
PAD ARMBOARD 7.5X6 YLW CONV (MISCELLANEOUS) ×6 IMPLANT
PATTIES SURGICAL .5 X1 (DISPOSABLE) ×2 IMPLANT
PATTIES SURGICAL .5 X3 (DISPOSABLE) IMPLANT
PATTIES SURGICAL 1X1 (DISPOSABLE) ×2 IMPLANT
ROD REVERSE 6.35 STRAIGHT 75MM (Rod) ×4 IMPLANT
SCREW REVERE 5.5X45 (Screw) ×4 IMPLANT
SCREW REVERE 6.35 5.5X40MM (Screw) ×16 IMPLANT
SPACER SUSTAIN O SML 10X22 12M (Spacer) ×2 IMPLANT
SPONGE GAUZE 4X4 12PLY (GAUZE/BANDAGES/DRESSINGS) ×2 IMPLANT
SPONGE LAP 4X18 X RAY DECT (DISPOSABLE) IMPLANT
SPONGE NEURO XRAY DETECT 1X3 (DISPOSABLE) IMPLANT
SPONGE SURGIFOAM ABS GEL 100 (HEMOSTASIS) ×2 IMPLANT
SPONGE SURGIFOAM ABS GEL SZ50 (HEMOSTASIS) ×2 IMPLANT
STRIP CLOSURE SKIN 1/2X4 (GAUZE/BANDAGES/DRESSINGS) ×2 IMPLANT
SUT VIC AB 1 CT1 18XBRD ANBCTR (SUTURE) ×1 IMPLANT
SUT VIC AB 1 CT1 8-18 (SUTURE) ×1
SUT VIC AB 2-0 CP2 18 (SUTURE) ×2 IMPLANT
SUT VIC AB 3-0 SH 8-18 (SUTURE) ×2 IMPLANT
SYR 20CC LL (SYRINGE) ×2 IMPLANT
SYR 20ML ECCENTRIC (SYRINGE) ×2 IMPLANT
SYR 5ML LL (SYRINGE) IMPLANT
TAPE CLOTH SURG 4X10 WHT LF (GAUZE/BANDAGES/DRESSINGS) ×2 IMPLANT
TOWEL OR 17X24 6PK STRL BLUE (TOWEL DISPOSABLE) ×2 IMPLANT
TOWEL OR 17X26 10 PK STRL BLUE (TOWEL DISPOSABLE) ×2 IMPLANT
TRAY FOLEY CATH 14FRSI W/METER (CATHETERS) ×2 IMPLANT
WATER STERILE IRR 1000ML POUR (IV SOLUTION) ×2 IMPLANT

## 2012-04-07 NOTE — Progress Notes (Signed)
PHARMACIST - PHYSICIAN COMMUNICATION  CONCERNING: P&T Medication Policy Regarding Oral Bisphosphonates  RECOMMENDATION: Your order for alendronate (Fosamax), ibandronate (Boniva), or risedronate (Actonel) has been discontinued at this time.  If the patient's post-hospital medical condition warrants safe use of this class of drugs, please resume the pre-hospital regimen upon discharge.  DESCRIPTION:  Alendronate (Fosamax), ibandronate (Boniva), and risedronate (Actonel) can cause severe esophageal erosions in patients who are unable to remain upright at least 30 minutes after taking this medication.   Since brief interruptions in therapy are thought to have minimal impact on bone mineral density, the Pharmacy & Therapeutics Committee has established that bisphosphonate orders should be routinely discontinued during hospitalization.   To override this safety policy and permit administration of Boniva, Fosamax, or Actonel in the hospital, prescribers must write "DO NOT HOLD" in the comments section when placing the order for this class of medications.  Christoper Fabian, PharmD, BCPS Clinical pharmacist, pager [phone] 04/07/2012  9:03 PM

## 2012-04-07 NOTE — H&P (Signed)
Shelby Bowman is an 67 y.o. female.   Chief Complaint: lbp HPI: patient who came to my office with her husband complaining of lbp with radiation to both lower extremities which gets better with lumbar flexion and when she walks she needs to stop frequently secondary to leg cramps. She has failed with every conservative treatment .  Past Medical History  Diagnosis Date  . Hyperlipidemia   . Osteoporosis   . Allergic rhinitis     "states on protonix for allegices per allergist"  . Hypothyroidism   . Diverticulosis   . Cellulitis of breast   . Adenomatous colon polyp 2007  . Hypertension     sees Zoe Lan, pa  . Anxiety   . Asthma     sees Dr. Lucie Leather  . Pneumonia     hx of, and hx of bronchitis  . Arthritis   . Macular degeneration     left eye  . Chronic neck pain     hx of  . Chronic back pain     hx of    Past Surgical History  Procedure Date  . Breast biopsy 1980's    Benign Right  . Total hip arthroplasty     x2    Family History  Problem Relation Age of Onset  . Hyperlipidemia Son   . Diabetes Son   . Cancer Maternal Grandmother   . Hypertension Brother   . Kidney failure Father   . Prostate cancer Paternal Grandfather   . Heart failure Mother   . Colon cancer Neg Hx    Social History:  reports that she quit smoking about 21 years ago. She has never used smokeless tobacco. She reports that she does not drink alcohol or use illicit drugs.  Allergies:  Allergies  Allergen Reactions  . Other Dermatitis    SILK  TAPE . PATIENT HAD A BLISTER TYPE RASH.     Medications Prior to Admission  Medication Sig Dispense Refill  . albuterol (PROVENTIL) (2.5 MG/3ML) 0.083% nebulizer solution Take 2.5 mg by nebulization every 6 (six) hours as needed. For shortness of breath/wheezing      . alendronate (FOSAMAX) 70 MG tablet Take 70 mg by mouth every 7 (seven) days. Take with a full glass of water on an empty stomach. On Monday      . beclomethasone (QVAR) 80  MCG/ACT inhaler Inhale 2 puffs into the lungs 2 (two) times daily.      . DULoxetine (CYMBALTA) 30 MG capsule Take 30 mg by mouth daily.        . fentaNYL (DURAGESIC - DOSED MCG/HR) 12 MCG/HR Place 1 patch onto the skin every 3 (three) days.      . fluticasone (FLONASE) 50 MCG/ACT nasal spray Place 2 sprays into the nose 2 (two) times daily.      . hydrochlorothiazide (HYDRODIURIL) 25 MG tablet Take 25 mg by mouth daily.       Marland Kitchen levocetirizine (XYZAL) 5 MG tablet Take 5 mg by mouth every evening.        Marland Kitchen levothyroxine (SYNTHROID, LEVOTHROID) 75 MCG tablet Take 75 mcg by mouth daily.        Marland Kitchen losartan-hydrochlorothiazide (HYZAAR) 50-12.5 MG per tablet Take 1 tablet by mouth daily.      . mometasone-formoterol (DULERA) 200-5 MCG/ACT AERO Inhale 2 puffs into the lungs 2 (two) times daily.      . montelukast (SINGULAIR) 10 MG tablet Take 10 mg by mouth at bedtime.        Marland Kitchen  Multiple Vitamin (MULTIVITAMIN WITH MINERALS) TABS Take 1 tablet by mouth daily.      . Multiple Vitamins-Calcium (VIACTIV MULTI-VITAMIN PO) Take 2 capsules by mouth 2 (two) times daily.       . nabumetone (RELAFEN) 750 MG tablet Take 750 mg by mouth 2 (two) times daily.        . pantoprazole (PROTONIX) 40 MG tablet Take 40 mg by mouth daily.      . pravastatin (PRAVACHOL) 20 MG tablet Take 20 mg by mouth daily.          No results found for this or any previous visit (from the past 48 hour(s)). No results found.  Review of Systems  Constitutional: Negative.   HENT: Positive for neck pain.   Respiratory:       Asthma  Cardiovascular: Negative.        Arterial hypertension  Gastrointestinal: Negative.   Genitourinary: Negative.   Musculoskeletal: Positive for back pain.  Skin: Negative.   Neurological: Positive for sensory change and focal weakness.  Endo/Heme/Allergies: Negative.   Psychiatric/Behavioral: Negative.     Blood pressure 162/72, pulse 60, temperature 98.2 F (36.8 C), temperature source Oral, resp.  rate 18, SpO2 97.00%. Physical Exam hent,nl. Neck,nl. cv nl. Lungs, clear. Abdomen,nl. Extremities, nl.NEURO  Weakness of dorsiflexion of both feet. SLR positive at 45 degrees, femoral stretch maneuver positive bilaterally. 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 Patient aware of risks and benefits  Makaylyn Sinyard M 04/07/2012, 11:38 AM

## 2012-04-07 NOTE — Anesthesia Postprocedure Evaluation (Signed)
  Anesthesia Post-op Note  Patient: Shelby Bowman  Procedure(s) Performed: Procedure(s) (LRB) with comments: POSTERIOR LUMBAR FUSION 4 LEVEL (N/A) - Lumbar Two-Three Lumbar Three-Four Lumbar Four-Five Lumbar Five-Sacral One, Diskectomy/cages/Pedicle screws/Posterolateral arthrodesis/Cellsaver  Patient Location: PACU  Anesthesia Type:General  Level of Consciousness: awake  Airway and Oxygen Therapy: Patient Spontanous Breathing  Post-op Pain: mild  Post-op Assessment: Post-op Vital signs reviewed  Post-op Vital Signs: Reviewed  Complications: No apparent anesthesia complications

## 2012-04-07 NOTE — Progress Notes (Signed)
Op note [phone]

## 2012-04-07 NOTE — Preoperative (Signed)
Beta Blockers   Reason not to administer Beta Blockers:Not Applicable 

## 2012-04-07 NOTE — Anesthesia Preprocedure Evaluation (Signed)
Anesthesia Evaluation  Patient identified by MRN, date of birth, ID band Patient awake    Reviewed: Allergy & Precautions, H&P , NPO status , Patient's Chart, lab work & pertinent test results  Airway Mallampati: II  Neck ROM: full    Dental   Pulmonary asthma , former smoker,          Cardiovascular hypertension,     Neuro/Psych Anxiety    GI/Hepatic   Endo/Other  Hypothyroidism   Renal/GU      Musculoskeletal  (+) Arthritis -,   Abdominal   Peds  Hematology   Anesthesia Other Findings   Reproductive/Obstetrics                           Anesthesia Physical Anesthesia Plan  ASA: II  Anesthesia Plan: General   Post-op Pain Management:    Induction: Intravenous  Airway Management Planned: Oral ETT  Additional Equipment:   Intra-op Plan:   Post-operative Plan: Extubation in OR  Informed Consent: I have reviewed the patients History and Physical, chart, labs and discussed the procedure including the risks, benefits and alternatives for the proposed anesthesia with the patient or authorized representative who has indicated his/her understanding and acceptance.     Plan Discussed with: CRNA and Surgeon  Anesthesia Plan Comments:         Anesthesia Quick Evaluation

## 2012-04-07 NOTE — Transfer of Care (Signed)
Immediate Anesthesia Transfer of Care Note  Patient: Shelby Bowman  Procedure(s) Performed: Procedure(s) (LRB) with comments: POSTERIOR LUMBAR FUSION 4 LEVEL (N/A) - Lumbar Two-Three Lumbar Three-Four Lumbar Four-Five Lumbar Five-Sacral One, Diskectomy/cages/Pedicle screws/Posterolateral arthrodesis/Cellsaver  Patient Location: PACU  Anesthesia Type:General  Level of Consciousness: awake and alert   Airway & Oxygen Therapy: Patient Spontanous Breathing and Patient connected to nasal cannula oxygen  Post-op Assessment: Report given to PACU RN and Post -op Vital signs reviewed and stable  Post vital signs: Reviewed and stable  Complications: No apparent anesthesia complications

## 2012-04-08 ENCOUNTER — Encounter (HOSPITAL_COMMUNITY): Payer: Self-pay | Admitting: Neurosurgery

## 2012-04-08 LAB — CBC WITH DIFFERENTIAL/PLATELET
HCT: 26.4 % — ABNORMAL LOW (ref 36.0–46.0)
Hemoglobin: 8.8 g/dL — ABNORMAL LOW (ref 12.0–15.0)
Lymphocytes Relative: 8 % — ABNORMAL LOW (ref 12–46)
Lymphs Abs: 0.8 10*3/uL (ref 0.7–4.0)
MCHC: 33.3 g/dL (ref 30.0–36.0)
Monocytes Absolute: 1.1 10*3/uL — ABNORMAL HIGH (ref 0.1–1.0)
Monocytes Relative: 11 % (ref 3–12)
Neutro Abs: 7.6 10*3/uL (ref 1.7–7.7)
WBC: 9.5 10*3/uL (ref 4.0–10.5)

## 2012-04-08 NOTE — Clinical Social Work Note (Signed)
Clinical Social Work   CSW received a consult for SNF. CSW reviewed chart. PT is recommending HHPT. CSW updated RNCM. CSW is signing off at this time, as no further needs identified. Please reconsult if a need arises prior to discharge.   Dede Query, MSW, Theresia Majors [phone]

## 2012-04-08 NOTE — Evaluation (Signed)
Occupational Therapy Evaluation Patient Details Name: Shelby Bowman MRN: [id number] DOB: May 31, 1945 Today's Date: 04/08/2012 Time: 0830-0900 OT Time Calculation (min): 30 min  OT Assessment / Plan / Recommendation Clinical Impression  Pt currently requiring Mod-Max Assist for lower body ADL's and selfcare. She will benefit from Acute OT to address deficits and maximize independence with ADL's and self care. Recommend 24hr Supervision/Assist and HHOT at discharge    OT Assessment  Patient needs continued OT Services    Follow Up Recommendations  Home health OT         Equipment Recommendations  Other (comment) (Cont to assess)       Frequency  Min 3X/week    Precautions / Restrictions Precautions Precautions: Back Precaution Booklet Issued: Yes (comment) Precaution Comments: pt with verbal understanding and able to recall at end of session Required Braces or Orthoses: Spinal Brace Spinal Brace: Lumbar corset Restrictions Weight Bearing Restrictions: No   Pertinent Vitals/Pain Pt reports 7-8/10 low back pain related to surgery and used PCA prior to session    ADL  Eating/Feeding: Simulated;Modified independent Where Assessed - Eating/Feeding: Bed level Grooming: Performed;Wash/dry hands;Set up;Min guard Where Assessed - Grooming: Supported sitting Upper Body Bathing: Simulated;Set up;Supervision/safety Where Assessed - Upper Body Bathing: Supported sitting Lower Body Bathing: Simulated;Moderate assistance Where Assessed - Lower Body Bathing: Supported sitting Upper Body Dressing: Performed;Min guard;Set up Where Assessed - Upper Body Dressing: Unsupported sitting Lower Body Dressing: Performed;Maximal assistance Where Assessed - Lower Body Dressing: Supported sit to stand Toilet Transfer: Simulated;Minimal assistance Toilet Transfer Method: Sit to Barista: Raised toilet seat with arms (or 3-in-1 over toilet) Toileting - Clothing Manipulation and  Hygiene: Simulated;Min guard Where Assessed - Glass blower/designer Manipulation and Hygiene: Sit on 3-in-1 or toilet Tub/Shower Transfer Method: Not assessed Equipment Used: Gait belt;Rolling walker Transfers/Ambulation Related to ADLs: Pt somewhat groggy and sleepy during assessment, Min-Min A transfers and VC's for safety and sequencing with RW as well as for back precautions ADL Comments: Pt currently requiring Mod-Max Assist for lower body ADL's and selfcare. She will benefit from Acute OT to address deficits and maximize independence with ADL's and self care. Recommend 24hr Supervision/Assist and HHOT at discharge.    OT Diagnosis: Acute pain;Generalized weakness  OT Problem List: Decreased knowledge of use of DME or AE;Decreased knowledge of precautions;Pain;Decreased activity tolerance OT Treatment Interventions: Self-care/ADL training;Energy conservation;DME and/or AE instruction;Therapeutic activities;Patient/family education   OT Goals Acute Rehab OT Goals OT Goal Formulation: With patient Time For Goal Achievement: 04/15/12 Potential to Achieve Goals: Good ADL Goals Pt Will Perform Grooming: with set-up;with supervision;Standing at sink ADL Goal: Grooming - Progress: Goal set today Pt Will Perform Upper Body Dressing: with supervision;with set-up;Sitting, chair;Sitting, bed ADL Goal: Upper Body Dressing - Progress: Goal set today Pt Will Perform Lower Body Dressing: with min assist;with adaptive equipment;Sit to stand from chair;Sit to stand from bed ADL Goal: Lower Body Dressing - Progress: Goal set today Pt Will Transfer to Toilet: with supervision;3-in-1;Ambulation;with DME;Maintaining back safety precautions ADL Goal: Toilet Transfer - Progress: Goal set today Pt Will Perform Toileting - Clothing Manipulation: with modified independence;Sitting on 3-in-1 or toilet ADL Goal: Toileting - Clothing Manipulation - Progress: Goal set today Pt Will Perform Toileting - Hygiene: with  modified independence;Sitting on 3-in-1 or toilet ADL Goal: Toileting - Hygiene - Progress: Goal set today Pt Will Perform Tub/Shower Transfer: with supervision;Ambulation;with DME;Shower seat with back ADL Goal: Web designer - Progress: Goal set today Additional ADL Goal #1: Pt will I  state back precautions related to ADL's and self care tasks. ADL Goal: Additional Goal #1 - Progress: Goal set today  Visit Information  Last OT Received On: 04/08/12 Assistance Needed: +1    Subjective Data  Subjective: Pt reports husband at home can assist after discharge Patient Stated Goal: Return home when able   Prior Functioning     Home Living Lives With: Spouse Available Help at Discharge: Family;Available 24 hours/day Type of Home: House Home Access: Stairs to enter [employer] of Steps: 1 Entrance Stairs-Rails: None Home Layout: Two level;Able to live on main level with bedroom/bathroom Bathroom Shower/Tub: Health visitor: Handicapped height Bathroom Accessibility: Yes How Accessible: Accessible via walker Home Adaptive Equipment: Grab bars in shower;Built-in shower seat;Reacher Additional Comments: spouse to be able to assist pt 24/7 Prior Function Level of Independence: Independent Able to Take Stairs?: Yes Driving: Yes Communication Communication: No difficulties Dominant Hand: Right    Vision/Perception  No changes from baseline   Cognition  Overall Cognitive Status: Appears within functional limits for tasks assessed/performed Arousal/Alertness: Awake/alert Orientation Level: Oriented X4 / Intact Behavior During Session: [facility] for tasks performed    Extremity/Trunk Assessment Right Upper Extremity Assessment RUE ROM/Strength/Tone: WFL for tasks assessed RUE Sensation: WFL - Light Touch Left Upper Extremity Assessment LUE ROM/Strength/Tone: WFL for tasks assessed LUE Sensation: WFL - Light Touch Right Lower Extremity Assessment RLE  ROM/Strength/Tone:  (full assessment limited by pain) RLE Sensation: WFL - Light Touch Left Lower Extremity Assessment LLE ROM/Strength/Tone:  (full assessment limited by LE pain) LLE Sensation: WFL - Light Touch Trunk Assessment Trunk Assessment: Normal     Mobility Bed Mobility Bed Mobility: Rolling Right;Right Sidelying to Sit;Sitting - Scoot to [employer] of Bed Rolling Right: 4: Min assist;With rail Right Sidelying to Sit: 3: Mod assist;With rails Sitting - Scoot to Edge of Bed: 4: Min assist;With rail Details for Bed Mobility Assistance: assist for trunk elevation and to control LEs off EOB Transfers Sit to Stand: 3: Mod assist;With upper extremity assist;From bed Stand to Sit: 4: Min assist;With armrests;To chair/3-in-1 Details for Transfer Assistance: increased time, v/c's for hand placement, modA to achieve full upright posture due to onset of pain at surgical site                End of Session OT - End of Session Equipment Utilized During Treatment: Gait belt;Other (comment) (RW) Activity Tolerance: Patient tolerated treatment well Patient left: in chair;with call bell/phone within reach  GO     Alm Bustard 04/08/2012, 11:24 AM

## 2012-04-08 NOTE — Progress Notes (Signed)
Patient ID: Shelby Bowman, female   DOB: 26-Mar-1945, 67 y.o.   MRN: [id number] No weakness but quite a lot of incisional pain, foley out . Tomorrow will remove the hemovac. Spoke with her and hus band.

## 2012-04-08 NOTE — Evaluation (Signed)
Physical Therapy Evaluation Patient Details Name: Shelby Bowman MRN: [id number] DOB: May 29, 1945 Today's Date: 04/08/2012 Time: 7846-9629 PT Time Calculation (min): 29 min  PT Assessment / Plan / Recommendation Clinical Impression  Pt 67 yo female s/p PLF x 4 levels presenting with expected back pain but also bilat thigh pain. Pt with generalized LE weakness and now requires use of RW for ambulation. Anticpate pt to be safe for d/c home with spouse provided 24/7 assist/supervision, RW, and HHPT.    PT Assessment  Patient needs continued PT services    Follow Up Recommendations  Home health PT;Supervision/Assistance - 24 hour    Does the patient have the potential to tolerate intense rehabilitation      Barriers to Discharge None      Equipment Recommendations  Rolling walker with 5" wheels    Recommendations for Other Services     Frequency Min 5X/week    Precautions / Restrictions Precautions Precautions: Back Precaution Booklet Issued: Yes (comment) Precaution Comments: pt with verbal understanding and able to recall at end of session Required Braces or Orthoses: Spinal Brace Spinal Brace: Lumbar corset Restrictions Weight Bearing Restrictions: No   Pertinent Vitals/Pain 7/10 surgical back pain and in bilat thighs/hips      Mobility  Bed Mobility Bed Mobility: Rolling Right;Right Sidelying to Sit;Sitting - Scoot to [place] of Bed Rolling Right: 4: Min assist;With rail Right Sidelying to Sit: 3: Mod assist;With rails Sitting - Scoot to Edge of Bed: 4: Min assist;With rail Details for Bed Mobility Assistance: assist for trunk elevation and to control LEs off EOB Transfers Transfers: Sit to Stand;Stand to Sit Sit to Stand: 3: Mod assist;With upper extremity assist;From bed Stand to Sit: 4: Min assist;With armrests;To chair/3-in-1 Details for Transfer Assistance: increased time, v/c's for hand placement, modA to achieve full upright posture due to onset of pain at  surgical site Ambulation/Gait Ambulation/Gait Assistance: 4: Min assist Ambulation Distance (Feet): 20 Feet Assistive device: Rolling walker Ambulation/Gait Assistance Details: gaurded, slow, decreased L LE WBing. pt c/o "I just feel so weak." Gait Pattern: Step-to pattern;Decreased step length - right;Decreased stance time - left;Decreased weight shift to left;Antalgic Gait velocity: slow Stairs: No    Shoulder Instructions     Exercises     PT Diagnosis: Difficulty walking;Acute pain  PT Problem List: Decreased strength;Decreased activity tolerance;Decreased balance;Decreased knowledge of use of DME PT Treatment Interventions: DME instruction;Gait training;Stair training;Functional mobility training;Therapeutic activities;Therapeutic exercise;Balance training   PT Goals Acute Rehab PT Goals PT Goal Formulation: With patient Time For Goal Achievement: 04/15/12 Potential to Achieve Goals: Good Pt will Roll Supine to Right Side: with modified independence PT Goal: Rolling Supine to Right Side - Progress: Goal set today Pt will go Supine/Side to Sit: with modified independence;with HOB 0 degrees PT Goal: Supine/Side to Sit - Progress: Goal set today Pt will go Sit to Stand: with supervision;with upper extremity assist (up to RW) PT Goal: Sit to Stand - Progress: Goal set today Pt will Ambulate: 51 - 150 feet;with supervision;with rolling walker PT Goal: Ambulate - Progress: Goal set today Pt will Go Up / Down Stairs: 1-2 stairs;with supervision;with rolling walker PT Goal: Up/Down Stairs - Progress: Goal set today Additional Goals Additional Goal #1: Pt to be independent with recall of 3/3 back precautions and 100% compliant. PT Goal: Additional Goal #1 - Progress: Goal set today  Visit Information  Last PT Received On: 04/08/12 Assistance Needed: +1 PT/OT Co-Evaluation/Treatment: Yes    Subjective Data  Subjective: Pt received  supine in bed agreeable to PT. Patient Stated  Goal: home   Prior Functioning  Home Living Lives With: Spouse Available Help at Discharge: Family;Available 24 hours/day Type of Home: House Home Access: Stairs to enter [employer] of Steps: 1 Entrance Stairs-Rails: None Home Layout: Two level;Able to live on main level with bedroom/bathroom Bathroom Shower/Tub: Health visitor: Handicapped height Bathroom Accessibility: Yes How Accessible: Accessible via walker Home Adaptive Equipment: Grab bars in shower;Built-in shower seat Additional Comments: spouse to be able to assist pt 24/7 Prior Function Level of Independence: Independent Able to Take Stairs?: Yes Driving: Yes Communication Communication: No difficulties Dominant Hand: Right    Cognition  Overall Cognitive Status: Appears within functional limits for tasks assessed/performed Arousal/Alertness: Awake/alert Orientation Level: Oriented X4 / Intact Behavior During Session: [facility] for tasks performed    Extremity/Trunk Assessment Right Upper Extremity Assessment RUE ROM/Strength/Tone: WFL for tasks assessed RUE Sensation: WFL - Light Touch Left Upper Extremity Assessment LUE ROM/Strength/Tone: WFL for tasks assessed LUE Sensation: WFL - Light Touch Right Lower Extremity Assessment RLE ROM/Strength/Tone: WFL for tasks assessed (full assessment limited by pain) RLE Sensation: WFL - Light Touch Left Lower Extremity Assessment LLE ROM/Strength/Tone: Deficits (full assessment limited by LE pain) LLE Sensation: WFL - Light Touch Trunk Assessment Trunk Assessment: Normal   Balance    End of Session PT - End of Session Equipment Utilized During Treatment: Gait belt;Back brace Activity Tolerance: Patient limited by pain Patient left: in chair;with call bell/phone within reach Nurse Communication: Mobility status  GP     Marcene Brawn 04/08/2012, 9:21 AM  Lewis Shock, PT, DPT Pager #: [phone] Office #: [phone]

## 2012-04-08 NOTE — Progress Notes (Signed)
UR COMPLETED  

## 2012-04-08 NOTE — Op Note (Signed)
Shelby Bowman, Shelby Bowman NO.:  [id number]  MEDICAL RECORD NO.:  [id number]  LOCATION:  [id number]                        FACILITY:  [facility]  PHYSICIAN:  Hilda Lias, M.D.   DATE OF BIRTH:  1945-11-05  DATE OF PROCEDURE:  04/07/2012 DATE OF DISCHARGE:                              OPERATIVE REPORT   PREOPERATIVE DIAGNOSES:  Lumbar scoliosis, degenerative disk disease, chronic radiculopathy, facet arthropathy, degenerative disk disease, 3 stenosis.  POSTOPERATIVE DIAGNOSES:  Lumbar scoliosis, degenerative disk disease, chronic radiculopathy, facet arthropathy, degenerative disk disease, 3 stenosis.  PROCEDURE:  Bilateral 2, 3, 4, 5 laminectomy, facetectomy, bilateral 3-4 diskectomy with interbody fusion with cage, pedicle screws L2 to L5-S1, posterolateral arthrodesis, L2-S1 with autograft and bone extender, Cell Saver, C-arm.  SURGEON:  Hilda Lias, M.D.  ASSISTANT:  Reinaldo Meeker, M.D.  CLINICAL HISTORY:  Shelby Bowman is a 67 year old female complaining of back pain worsened to both legs.  The patient had the diagnosis of scoliosis with stenosis for many years.  The pain is getting worse up to the point right now the walking is almost impossible.  She has to rest because she developed cramps in both legs.  X-ray showed severe scoliosis with degenerative disk disease, facet arthropathy, foraminal narrowing.  Surgery was advised.  She and her husband knew the risk with the surgery including possibility of no improvement whatsoever.  PROCEDURE:  The patient was taken to the OR, and after intubation, she was positioned in a prone manner.  The back was cleaned with Betadine and DuraPrep.  Drapes were applied.  Midline incision from L1 to L5-S1 was made and muscle were retracted all the way laterally until we were able to see and feel the transverse process of 2, 3, 4, 5.  From then on, with the [place] we proceeded with removal of spinous process of 2, 3, 4, 5,  and then the lamina.  The patient had quite a bit of the scoliosis with convexity towards the right side.  We started drilling from the bottom up the lamina of L5 to decompress the thecal sac all the way down to the junction between L2 and 3.  Facetectomy was accomplished at those levels.  We tried to get into the disk space of 2-3, 4-5, and 5- 1, but it was almost impossible secondary to the area being quite narrow and calcified.  At the level of 3-4, we were able to get into the disk space, first lateral and then in the left side and then laterally in the right side.  Diskectomy was done, and the endplate was removed.  At this level, we were to introduce 1 single plate of 12 x 22, mostly centrally towards the left side.  The rest of the disk space was filled with autograft and bone extender.  Then using the C-arm in AP view and then a lateral view, we went ahead and probed the pedicle of 2, 3, 4, 5, and S1.  At the level of 2, 3, 4, 5 we introduced pedicle screws 5.5 x 40, and at the level of S1, 5.5 x 45.  The screw were connected using a rod and Capps.  A cross-link from left-to-right was  done.  Then with the drill, we removed the periosteum of 3-4, 4-5, 5-1 and the ala of the sacrum.  A mix of autograft and bone extender was used for arthrodesis. Using the hockey stick, we investigated the foramen just to be sure that there was plenty of space.  There was no compromise of the medial wall of the pedicle.  The area was irrigated, a large drain was left in the epidural space and the wound was closed with Vicryl and Steri-Strips.          ______________________________ Hilda Lias, M.D.     EB/MEDQ  D:  04/07/2012  T:  04/08/2012  Job:  [id number]

## 2012-04-08 NOTE — Progress Notes (Signed)
Pt void once in the restroom this afternoon, but was having trouble to void again. Did a straight cath and emptied 350 ml @ 19:10. Pt tolerated well.

## 2012-04-09 MED ORDER — WHITE PETROLATUM GEL
Status: AC
  Filled 2012-04-09: qty 5

## 2012-04-09 NOTE — Progress Notes (Signed)
Physical Therapy Treatment Patient Details Name: Shelby Bowman MRN: [id number] DOB: 08-07-1945 Today's Date: 04/09/2012 Time: 0217-0235 PT Time Calculation (min): 18 min  PT Assessment / Plan / Recommendation Comments on Treatment Session  Pt making steady progress with mobility-- increased ambulation distance & required decreased (A) for transfers.  Pt c/o weakness & fatigues quickly.      Follow Up Recommendations  Home health PT;Supervision/Assistance - 24 hour     Does the patient have the potential to tolerate intense rehabilitation     Barriers to Discharge        Equipment Recommendations  Rolling walker with 5" wheels    Recommendations for Other Services    Frequency Min 5X/week   Plan Discharge plan remains appropriate    Precautions / Restrictions Precautions Precautions: Back Precaution Comments: pt with verbal understanding and able to recall at end of session Required Braces or Orthoses: Spinal Brace Spinal Brace: Lumbar corset   Pertinent Vitals/Pain 6/10 incisional pain & bil LE pain.  Pt fatigues quickly.      Mobility  Bed Mobility Bed Mobility: Sit to Sidelying Right;Sit to Supine Sit to Sidelying Right: 4: Min assist;HOB flat Details for Bed Mobility Assistance: (A) to lift LE's onto bed & to prevent twisting of torso.  Cues for sequencing & technique.   Transfers Transfers: Sit to Stand;Stand to Sit Sit to Stand: 4: Min assist;With upper extremity assist;From bed;From toilet Stand to Sit: 4: Min assist;With upper extremity assist;To toilet;To bed Details for Transfer Assistance: Cues for safe hand placement & technique.  (A) to achieve standing, balance, & controlled descent Ambulation/Gait Ambulation/Gait Assistance: 4: Min guard Ambulation Distance (Feet): 60 Feet Assistive device: Rolling walker Ambulation/Gait Assistance Details: Pt with small, slow steps with step-to gait pattern & a pause between next step.    Gait Pattern: Step-to  pattern;Decreased step length - left;Decreased step length - right;Antalgic;Trunk flexed Gait velocity: slow General Gait Details: Fatigues quickly Stairs: No Wheelchair Mobility Wheelchair Mobility: No      PT Goals Acute Rehab PT Goals Time For Goal Achievement: 04/15/12 Potential to Achieve Goals: Good Pt will Roll Supine to Right Side: with modified independence Pt will go Supine/Side to Sit: with modified independence;with HOB 0 degrees Pt will go Sit to Stand: with supervision;with upper extremity assist PT Goal: Sit to Stand - Progress: Progressing toward goal Pt will Ambulate: 51 - 150 feet;with supervision;with rolling walker PT Goal: Ambulate - Progress: Progressing toward goal Pt will Go Up / Down Stairs: 1-2 stairs;with supervision;with rolling walker Additional Goals Additional Goal #1: Pt to be independent with recall of 3/3 back precautions and 100% compliant. PT Goal: Additional Goal #1 - Progress: Progressing toward goal  Visit Information  Last PT Received On: 04/09/12 Assistance Needed: +1    Subjective Data      Cognition  Overall Cognitive Status: Appears within functional limits for tasks assessed/performed Arousal/Alertness: Awake/alert Orientation Level: Oriented X4 / Intact Behavior During Session: [facility] for tasks performed    Balance     End of Session PT - End of Session Equipment Utilized During Treatment: Gait belt;Back brace Activity Tolerance: Patient limited by fatigue Patient left: in bed;with call bell/phone within reach;with family/visitor present Nurse Communication: Mobility status     Verdell Face, [place] [phone] 04/09/2012

## 2012-04-09 NOTE — Progress Notes (Signed)
Patient ID: Shelby Bowman, female   DOB: 06/07/1945, 67 y.o.   MRN: [id number] Still complaining of incisional pain but no weakness. Will continue with morphine. Rehabilitation to see.

## 2012-04-09 NOTE — Progress Notes (Signed)
Physical medicine and rehabilitation consult was requested. Patient's status post lumbar laminectomy 04/07/2012. Initial evaluations by physical and occupational therapy completed 04/08/2012 a patient with significant progress with recommendations of home health therapies and assistance by her spouse. Patient at this time does not meet medical necessity for inpatient rehabilitation services. Will discuss with case management on plan to proceed with home health therapies once medically stable

## 2012-04-10 MED ORDER — GABAPENTIN 300 MG PO CAPS
300.0000 mg | ORAL_CAPSULE | Freq: Three times a day (TID) | ORAL | Status: DC
  Administered 2012-04-10 – 2012-04-14 (×13): 300 mg via ORAL
  Filled 2012-04-10 (×16): qty 1

## 2012-04-10 MED ORDER — FLEET ENEMA 7-19 GM/118ML RE ENEM
1.0000 | ENEMA | Freq: Every day | RECTAL | Status: DC | PRN
  Administered 2012-04-10 – 2012-04-14 (×2): 1 via RECTAL
  Filled 2012-04-10 (×2): qty 1

## 2012-04-10 MED ORDER — MORPHINE SULFATE 2 MG/ML IJ SOLN
2.0000 mg | INTRAMUSCULAR | Status: DC | PRN
  Administered 2012-04-10 – 2012-04-11 (×2): 2 mg via INTRAVENOUS
  Filled 2012-04-10 (×2): qty 1

## 2012-04-10 MED ORDER — OXYCODONE HCL 5 MG PO TABS
15.0000 mg | ORAL_TABLET | ORAL | Status: DC | PRN
  Administered 2012-04-11 – 2012-04-12 (×4): 15 mg via ORAL
  Filled 2012-04-10 (×4): qty 3

## 2012-04-10 MED FILL — Sodium Chloride IV Soln 0.9%: INTRAVENOUS | Qty: 1000 | Status: AC

## 2012-04-10 MED FILL — Heparin Sodium (Porcine) Inj 1000 Unit/ML: INTRAMUSCULAR | Qty: 30 | Status: AC

## 2012-04-10 MED FILL — Sodium Chloride Irrigation Soln 0.9%: Qty: 3000 | Status: AC

## 2012-04-10 NOTE — Progress Notes (Signed)
NCM spoke to husband, Simona Rocque, # [phone] about [facility] PT/OT. States he prefers NCM speak to wife about HH when she wakes up. States she will definitely need RW for home. Provided husband with list of HH agency.  Isidoro Donning RN CCM Case Mgmt phone [phone]

## 2012-04-10 NOTE — Progress Notes (Addendum)
Physical Therapy Treatment Patient Details Name: Shelby Bowman MRN: [id number] DOB: 1946-02-18 Today's Date: 04/10/2012 Time: 4098-1191 PT Time Calculation (min): 23 min  PT Assessment / Plan / Recommendation Comments on Treatment Session  Pt. reports weaker feeling today which is demonstrated in a decreased distance tolerance.  If pt. does not significantly improve in next day or so, may need to look at [facility] for rehab.      Follow Up Recommendations  Home health PT;Supervision/Assistance - 24 hour     Does the patient have the potential to tolerate intense rehabilitation     Barriers to Discharge        Equipment Recommendations  Rolling walker with 5" wheels    Recommendations for Other Services    Frequency Min 5X/week   Plan Discharge plan remains appropriate    Precautions / Restrictions Precautions Precautions: Back Precaution Comments: pt. recalled 1/3 back precautions initially, 3/3 with prompting Required Braces or Orthoses: Spinal Brace Spinal Brace: Lumbar corset Restrictions Weight Bearing Restrictions: No   Pertinent Vitals/Pain Pain in back at 5-6 level.  RN aware and pt. Was repositioned for comfort after walking.    Mobility  Bed Mobility Bed Mobility: Rolling Left;Left Sidelying to Sit;Sitting - Scoot to [place] of Bed;Sit to Sidelying Left Rolling Left: 4: Min assist;With rail Left Sidelying to Sit: 3: Mod assist Sitting - Scoot to Edge of Bed: 4: Min assist;With rail Sit to Sidelying Left: 3: Mod assist Details for Bed Mobility Assistance: assist for LEs up to bed and to prevent twisting Transfers Transfers: Sit to Stand;Stand to Sit Sit to Stand: 4: Min assist;From bed;With upper extremity assist Stand to Sit: 4: Min assist;With upper extremity assist;To bed Details for Transfer Assistance: cues for hand placement and for precautions; assist to rise to stand with emphasis on straightening LEs and keepind g spine as straight as  possible. Ambulation/Gait Ambulation/Gait Assistance: 4: Min assist Ambulation Distance (Feet): 24 Feet Assistive device: Rolling walker Ambulation/Gait Assistance Details: Pt. needed occasional assist to manage RW for avoiding objects and for advancing.  Cues for erect posture and assist for balance and stability. Gait Pattern: Step-to pattern;Decreased step length - right;Decreased step length - left;Trunk flexed Gait velocity: slow General Gait Details: Fatigues quickly Stairs: No Wheelchair Mobility Wheelchair Mobility: No    Exercises     PT Diagnosis:    PT Problem List:   PT Treatment Interventions:     PT Goals Acute Rehab PT Goals PT Goal: Supine/Side to Sit - Progress: Progressing toward goal PT Goal: Sit to Stand - Progress: Progressing toward goal PT Goal: Ambulate - Progress: Progressing toward goal Additional Goals PT Goal: Additional Goal #1 - Progress: Progressing toward goal  Visit Information  Last PT Received On: 04/10/12 Assistance Needed: +1    Subjective Data  Subjective: I have felt weak today   Cognition  Overall Cognitive Status: Appears within functional limits for tasks assessed/performed Arousal/Alertness: Awake/alert Orientation Level: Oriented X4 / Intact Behavior During Session: [facility] for tasks performed    Balance     End of Session PT - End of Session Equipment Utilized During Treatment: Gait belt;Back brace Activity Tolerance: Patient limited by fatigue Patient left: in bed;with call bell/phone within reach;with family/visitor present; bed alarm set Nurse Communication: Mobility status   GP     Ferman Hamming 04/10/2012, 3:03 PM Weldon Picking PT Acute Rehab Services [phone] [place] [phone]

## 2012-04-10 NOTE — Progress Notes (Signed)
Foley catheter re-inserted as previously ordered, patient have been having urinary retention problem. PVR@2000  is and @0035  is . Pt still continues to c/o severe pain on both thighs. Will continue to monitor.

## 2012-04-10 NOTE — Progress Notes (Signed)
Occupational Therapy Treatment Patient Details Name: Shelby Bowman MRN: [id number] DOB: [birth year] Today's Date: 04/10/2012 Time: 1191-4782 OT Time Calculation (min): 19 min  OT Assessment / Plan / Recommendation Comments on Treatment Session Pt continues to require mod - max A for ADLs.  Pt. requires mod A for bed mobiity, and min A to maintain dynamic sitting balance.  Pt. not progressing with OT.  If pt does not show significant improvement in next 24 hours, or so, she may need SNF level rehab    Follow Up Recommendations  Home health OT;SNF;Supervision/Assistance - 24 hour    Barriers to Discharge       Equipment Recommendations   (TBD)    Recommendations for Other Services    Frequency Min 3X/week   Plan Discharge plan remains appropriate    Precautions / Restrictions Precautions Precautions: Back Precaution Booklet Issued: Yes (comment) Precaution Comments: Pt able to recall 2/3 back precautions Required Braces or Orthoses: Spinal Brace Spinal Brace: Lumbar corset Restrictions Weight Bearing Restrictions: No   Pertinent Vitals/Pain     ADL  Lower Body Dressing: Maximal assistance Where Assessed - Lower Body Dressing: Supported sit to stand Transfers/Ambulation Related to ADLs: sit to stand with min A ADL Comments: Pt reports she used AE after THA, however, required max assist to use during OT.  Pt. required min a to maintain sitting balance EOB.  And required step by step cues for LB ADLs/use of AE.  Pt reports she hasn't slept, and pain is hindering her ability to maintain balance    OT Diagnosis:    OT Problem List:   OT Treatment Interventions:     OT Goals ADL Goals ADL Goal: Lower Body Dressing - Progress: Not progressing ADL Goal: Toilet Transfer - Progress: Not progressing ADL Goal: Additional Goal #1 - Progress: Progressing toward goals  Visit Information  Last OT Received On: 04/10/12 Assistance Needed: +1    Subjective Data      Prior  Functioning       Cognition  Overall Cognitive Status: Impaired Arousal/Alertness: Lethargic Orientation Level: Oriented X4 / Intact Behavior During Session: Lethargic Cognition - Other Comments: Pt with selective attention and mod A for problem solving - question if due to meds    Mobility  Shoulder Instructions Bed Mobility Bed Mobility: Rolling Left;Left Sidelying to Sit;Sitting - Scoot to [place] of Bed;Sit to Sidelying Left Rolling Left: 4: Min assist;With rail Left Sidelying to Sit: 3: Mod assist;With rails Sitting - Scoot to Edge of Bed: 4: Min assist;With rail Sit to Sidelying Left: 3: Mod assist;HOB flat;With rail Details for Bed Mobility Assistance: assist for LEs up to bed and to prevent twisting. and step by step cues Transfers Transfers: Sit to Stand;Stand to Sit Sit to Stand: 4: Min assist;From bed;With upper extremity assist Stand to Sit: 4: Min assist;With upper extremity assist;To bed Details for Transfer Assistance: cues for hand placement and cues to prevent bending       Exercises      Balance Balance Balance Assessed: Yes Dynamic Sitting Balance Dynamic Sitting - Balance Support: Left upper extremity supported;Right upper extremity supported;Feet supported Dynamic Sitting - Level of Assistance: 4: Min assist Dynamic Sitting Balance - Compensations: Pt falls to Rt. and has to hold onto rail to maintain balance Dynamic Sitting - Balance Activities: Other (comment) (LB ADLs)   End of Session OT - End of Session Equipment Utilized During Treatment: Back brace Activity Tolerance: Patient limited by fatigue;Patient limited by pain Patient left: in bed;with  call bell/phone within reach;with family/visitor present Nurse Communication: Mobility status  GO     Jeani Hawking M 04/10/2012, 4:54 PM

## 2012-04-10 NOTE — Progress Notes (Signed)
Patient ID: Shelby Bowman, female   DOB: [birth year], 67 y.o.   MRN: [id number] C/o cramps in both legs, no weakness.wound dry. Constipated . See orders

## 2012-04-11 DIAGNOSIS — M419 Scoliosis, unspecified: Secondary | ICD-10-CM | POA: Diagnosis present

## 2012-04-11 MED ORDER — BISACODYL 5 MG PO TBEC
5.0000 mg | DELAYED_RELEASE_TABLET | Freq: Every day | ORAL | Status: DC | PRN
  Administered 2012-04-13: 5 mg via ORAL
  Filled 2012-04-11: qty 1

## 2012-04-11 NOTE — Progress Notes (Signed)
Late entry:  At 1700 pt up to bathroom, voided, did oral care.  Returned to bed.  Did not put oxygen back on, sats down to lo 80's, pt reminded to keep oxygen on.  O2 on at 3 liter, sat coming up, encouraged to use IS>

## 2012-04-11 NOTE — Progress Notes (Signed)
Foley cathater removed per order, no difficulties.  Pt. Resting quietly.

## 2012-04-11 NOTE — Progress Notes (Signed)
Occupational Therapy Treatment Patient Details Name: Shelby Bowman MRN: [id number] DOB: 17-Jul-1945 Today's Date: 04/11/2012 Time: 1478-2956 OT Time Calculation (min): 26 min  OT Assessment / Plan / Recommendation Comments on Treatment Session Pt with increased participation and awareness compared to previous sessions.  Pt Overall min assist level for toileting tasks, bed mobility, and short distance ambulation.  O2 sats still decreasing to 86% on room air with activity, 91% on room air at rest.    Follow Up Recommendations  Home health OT       Equipment Recommendations  None recommended by OT       Frequency Min 3X/week   Plan Discharge plan remains appropriate    Precautions / Restrictions Precautions Precautions: Back Precaution Booklet Issued: Yes (comment) Precaution Comments: Pt able to recall 2/3 back precautions Required Braces or Orthoses: Spinal Brace Spinal Brace: Lumbar corset Restrictions Weight Bearing Restrictions: No   Pertinent Vitals/Pain Pt with only minimal pain stated, no number given,  O2 sats 95% on 2Ls at rest, decreased to 86% with activity on room air    ADL  Grooming: Performed;Minimal assistance Where Assessed - Grooming: Supported standing Toilet Transfer: Performed;Minimal Dentist Method: Other (comment) (ambulate to the toilet with the RW) Acupuncturist: Comfort height toilet;Grab bars Where Assessed - Toileting Clothing Manipulation and Hygiene: Sit to stand from 3-in-1 or toilet Transfers/Ambulation Related to ADLs: Pt able to ambulate with RW to the toilet with min assist. ADL Comments: Pt able to state 3/3 back precautions.  Needed mod assist at EOB for donning lumbar corset.  Discussed use of AE and husband states that they have a reacher and have seen the sockaide before.  Pt still demonstrates decreased oxygen sats with mobility.  Decreased down to 86% on room air.  Increased to 95% on 2Ls nasal cannula.     OT Goals ADL Goals ADL Goal: Grooming - Progress: Progressing toward goals ADL Goal: Lower Body Dressing - Progress: Progressing toward goals ADL Goal: Toilet Transfer - Progress: Progressing toward goals ADL Goal: Toileting - Clothing Manipulation - Progress: Progressing toward goals ADL Goal: Toileting - Hygiene - Progress: Progressing toward goals ADL Goal: Additional Goal #1 - Progress: Met  Visit Information  Last OT Received On: 04/11/12 Assistance Needed: +1    Subjective Data  Subjective: She's doing so much better today. (husband) Patient Stated Goal: Did not state during this session.      Cognition  Overall Cognitive Status: Appears within functional limits for tasks assessed/performed Arousal/Alertness: Awake/alert Orientation Level: Appears intact for tasks assessed Behavior During Session: [facility] for tasks performed    Mobility  Shoulder Instructions Bed Mobility Rolling Right: 4: Min assist;With rail Right Sidelying to Sit: 4: Min assist;HOB flat;With rails Transfers Transfers: Sit to Stand Sit to Stand: With upper extremity assist;4: Min assist Stand to Sit: 4: Min assist;Without upper extremity assist          Balance Balance Balance Assessed: Yes Static Standing Balance Static Standing - Balance Support: Right upper extremity supported;Left upper extremity supported Static Standing - Level of Assistance: 4: Min assist   End of Session OT - End of Session Equipment Utilized During Treatment: Gait belt;Back brace Activity Tolerance: Patient limited by pain Patient left: in bed;with call bell/phone within reach;with family/visitor present Nurse Communication: Mobility status     Adrain Nesbit OTR/L Pager number [phone] 04/11/2012, 12:53 PM

## 2012-04-11 NOTE — Progress Notes (Signed)
Subjective: Patient reports Mobilizing a bit better today. Patient is up and out of bed.  Objective: Vital signs in last 24 hours: Temp:  [98.6 F (37 C)-100.6 F (38.1 C)] 98.6 F (37 C) (02/01 1001) Pulse Rate:  [71-87] 75  (02/01 1001) Resp:  [18-20] 18  (02/01 1001) BP: (115-136)/(46-68) 115/46 mmHg (02/01 1001) SpO2:  [93 %-100 %] 96 % (02/01 1001)  Intake/Output from previous day: 01/31 0701 - 02/01 0700 In: 723 [P.O.:720; I.V.:3] Out: 1450 [Urine:1450] Intake/Output this shift:    Motor function appears intact in lower extremities.  Lab Results:  Basename 04/08/12 1707  WBC 9.5  HGB 8.8*  HCT 26.4*  PLT 150   BMET No results found for this basename: NA:2,K:2,CL:2,CO2:2,GLUCOSE:2,BUN:2,CREATININE:2,CALCIUM:2 in the last 72 hours  Studies/Results: No results found.  Assessment/Plan: Stable postop day 4  LOS: 4 days  Discontinued Foley   Shelby Bowman J 04/11/2012, 12:28 PM

## 2012-04-11 NOTE — Progress Notes (Signed)
Physical Therapy Treatment Patient Details Name: Shelby Bowman MRN: [id number] DOB: 1945-08-22 Today's Date: 04/11/2012 Time: 2952-8413 PT Time Calculation (min): 27 min  PT Assessment / Plan / Recommendation Comments on Treatment Session  Pt progressing slowly with mobility.  Pain limiting quicker progression per pt.      Follow Up Recommendations  Home health PT;Supervision/Assistance - 24 hour     Does the patient have the potential to tolerate intense rehabilitation     Barriers to Discharge        Equipment Recommendations  Rolling walker with 5" wheels    Recommendations for Other Services    Frequency Min 5X/week   Plan Discharge plan remains appropriate;Other (comment) (SNF if does not progress)    Precautions / Restrictions Precautions Precautions: Back Precaution Booklet Issued: Yes (comment) Precaution Comments: Pt able to recall 2/3 back precautions Required Braces or Orthoses: Spinal Brace Spinal Brace: Lumbar corset Restrictions Weight Bearing Restrictions: No   Pertinent Vitals/Pain 6/10 LE's.  RN administered oral pain medication.      Mobility  Bed Mobility Bed Mobility: Sitting - Scoot to Edge of Bed;Rolling Right;Right Sidelying to Sit Rolling Right: 4: Min assist;With rail Right Sidelying to Sit: 4: Min assist;HOB flat;With rails Sitting - Scoot to Edge of Bed: 4: Min assist Details for Bed Mobility Assistance: Cues for sequencing & technique.  (A) to maintain logrolling onto Lt side & to lift shoulders/trunk to sitting upright Transfers Transfers: Sit to Stand;Stand to Sit Sit to Stand: 4: Min assist;With upper extremity assist;From bed;From chair/3-in-1;With armrests Stand to Sit: 4: Min assist;With upper extremity assist;With armrests;To chair/3-in-1 Details for Transfer Assistance: Cues for hand placement, decrease trunk flexion, & technique.  (A) to achieve standing, balance, & controlled descent.   Ambulation/Gait Ambulation/Gait Assistance:  4: Min guard Ambulation Distance (Feet): 35 Feet Assistive device: Rolling walker Ambulation/Gait Assistance Details: Cues for body positioning inside RW, tall posture, use of UE's to offload wt from painful LE's.   Pt required max encouragement.   Gait Pattern: Step-to pattern;Decreased step length - right;Decreased step length - left;Decreased stance time - right;Decreased stance time - left;Shuffle;Antalgic Gait velocity: slow Wheelchair Mobility Wheelchair Mobility: No     PT Goals Acute Rehab PT Goals Time For Goal Achievement: 04/15/12 Potential to Achieve Goals: Good Pt will Roll Supine to Right Side: with modified independence PT Goal: Rolling Supine to Right Side - Progress: Not met Pt will go Supine/Side to Sit: with modified independence;with HOB 0 degrees PT Goal: Supine/Side to Sit - Progress: Not met Pt will go Sit to Stand: with supervision;with upper extremity assist PT Goal: Sit to Stand - Progress: Not met Pt will Ambulate: 51 - 150 feet;with supervision;with rolling walker PT Goal: Ambulate - Progress: Progressing toward goal Pt will Go Up / Down Stairs: 1-2 stairs;with supervision;with rolling walker Additional Goals Additional Goal #1: Pt to be independent with recall of 3/3 back precautions and 100% compliant. PT Goal: Additional Goal #1 - Progress: Not met  Visit Information  Last PT Received On: 04/11/12 Assistance Needed: +1    Subjective Data      Cognition  Overall Cognitive Status: Appears within functional limits for tasks assessed/performed Arousal/Alertness: Awake/alert Orientation Level: Appears intact for tasks assessed Behavior During Session: [facility] for tasks performed    Balance  Balance Balance Assessed: No Static Standing Balance Static Standing - Balance Support: Right upper extremity supported;Left upper extremity supported Static Standing - Level of Assistance: 4: Min assist  End of Session PT -  End of Session Equipment Utilized  During Treatment: Gait belt;Back brace Activity Tolerance: Patient limited by pain;Patient limited by fatigue Patient left: in chair;with call bell/phone within reach;with family/visitor present Nurse Communication: Mobility status     Verdell Face, [place] [phone] 04/11/2012

## 2012-04-11 NOTE — Progress Notes (Signed)
Physical Therapy Treatment Patient Details Name: Shelby Bowman MRN: [id number] DOB: [birth year] Today's Date: 04/11/2012 Time: 2440-1027 PT Time Calculation (min): 23 min  PT Assessment / Plan / Recommendation Comments on Treatment Session  Pain still limiting factor.  Will plan to have pt's husband assist pt. in PT session tomorrow and to assess if he can safely assist her at home.  He was educated today on safe sit<>stand technique today.  Pt. strongly encouraged to request assist to be up at all meals.  She says she will do so.  Progress remains less than ideal.    Follow Up Recommendations  Home health PT;Supervision/Assistance - 24 hour;Other (comment) (SNF if nnot adequate progress)     Does the patient have the potential to tolerate intense rehabilitation     Barriers to Discharge        Equipment Recommendations  Rolling walker with 5" wheels    Recommendations for Other Services    Frequency Min 5X/week   Plan Discharge plan remains appropriate;Other (comment) (with adequate progress)    Precautions / Restrictions Precautions Precautions: Back Precaution Booklet Issued: Yes (comment) Precaution Comments: Pt able to recall 2/3 back precautions Required Braces or Orthoses: Spinal Brace Spinal Brace: Lumbar corset Restrictions Weight Bearing Restrictions: No   Pertinent Vitals/Pain Pain in low back and right leg 5/10.  Pain med requested , RN made aware.    Mobility  Bed Mobility Bed Mobility: Rolling Left;Left Sidelying to Sit;Sitting - Scoot to Edge of Bed;Sit to Sidelying Left Rolling Right: 4: Min assist;With rail Rolling Left: 4: Min assist;With rail Right Sidelying to Sit: 4: Min assist;HOB flat;With rails Left Sidelying to Sit: 3: Mod assist;With rails Sitting - Scoot to Edge of Bed: 4: Min guard Sit to Supine: 3: Mod assist Sit to Sidelying Left: 3: Mod assist;HOB flat;With rail Details for Bed Mobility Assistance: Cues for back precaution adherence, log  rolling and mod assist for side to sit with facilitation at shoulders.  Moda assist for sit to supine primarily for raising LEs. Transfers Transfers: Sit to Stand;Stand to Sit Sit to Stand: 4: Min assist;With upper extremity assist;From bed Stand to Sit: 4: Min assist;With upper extremity assist;To bed Details for Transfer Assistance: Min assist for balance during sit<>stand as pt. has posterior tendancy Ambulation/Gait Ambulation/Gait Assistance: 4: Min guard Ambulation Distance (Feet): 150 Feet Assistive device: Rolling walker Ambulation/Gait Assistance Details: Pt. needs min guard assist for safety and balance.  She needs several brief standing rest stops along the way due to increasing R LE pain as she walks. Gait Pattern: Step-to pattern;Decreased step length - right;Decreased step length - left;Decreased stance time - right;Decreased stance time - left;Shuffle;Antalgic Gait velocity: slow General Gait Details: Fatigues quickly Stairs: No Wheelchair Mobility Wheelchair Mobility: No    Exercises     PT Diagnosis:    PT Problem List:   PT Treatment Interventions:     PT Goals Acute Rehab PT Goals Time For Goal Achievement: 04/15/12 Potential to Achieve Goals: Good Pt will Roll Supine to Right Side: with modified independence PT Goal: Rolling Supine to Right Side - Progress: Not met Pt will go Supine/Side to Sit: with modified independence;with HOB 0 degrees PT Goal: Supine/Side to Sit - Progress: Progressing toward goal Pt will go Sit to Stand: with supervision;with upper extremity assist PT Goal: Sit to Stand - Progress: Progressing toward goal Pt will Ambulate: 51 - 150 feet;with supervision;with rolling walker PT Goal: Ambulate - Progress: Progressing toward goal Pt will Go Up /  Down Stairs: 1-2 stairs;with supervision;with rolling walker Additional Goals Additional Goal #1: Pt to be independent with recall of 3/3 back precautions and 100% compliant. PT Goal: Additional  Goal #1 - Progress: Progressing toward goal  Visit Information  Last PT Received On: 04/11/12 Assistance Needed: +1    Subjective Data  Subjective: "It really starts to hurt in my back and right leg when I am up"   Cognition  Overall Cognitive Status: Appears within functional limits for tasks assessed/performed Arousal/Alertness: Awake/alert Orientation Level: Appears intact for tasks assessed Behavior During Session: [facility] for tasks performed    Balance  Balance Balance Assessed: No Static Standing Balance Static Standing - Balance Support: Right upper extremity supported;Left upper extremity supported Static Standing - Level of Assistance: 4: Min assist  End of Session PT - End of Session Equipment Utilized During Treatment: Gait belt;Back brace Activity Tolerance: Patient limited by pain;Patient limited by fatigue Patient left: in bed;with call bell/phone within reach;with family/visitor present Nurse Communication: Mobility status   GP     Ferman Hamming 04/11/2012, 3:07 PM Weldon Picking PT Acute Rehab Services [phone] [place] [phone]

## 2012-04-12 MED ORDER — OXYCODONE HCL 5 MG PO TABS
10.0000 mg | ORAL_TABLET | ORAL | Status: DC | PRN
  Administered 2012-04-12 – 2012-04-13 (×2): 10 mg via ORAL
  Administered 2012-04-13: 5 mg via ORAL
  Administered 2012-04-13 – 2012-04-15 (×9): 10 mg via ORAL
  Filled 2012-04-12: qty 1
  Filled 2012-04-12 (×10): qty 2
  Filled 2012-04-12: qty 1

## 2012-04-12 NOTE — Progress Notes (Signed)
Late entry: 0800 Pt up in chair, very lethargic, having difficulty in staying awake, O2 sats drop when not wearing oxygen, reminded to keep oxygen on, and using IS.  Korine Winton RN

## 2012-04-12 NOTE — Progress Notes (Signed)
Subjective: Patient reports Patient is extremely sleepy and somnolent. Sats are decreasing to 70's well patient is trying to sit up to eat breakfast  Objective: Vital signs in last 24 hours: Temp:  [98.4 F (36.9 C)-98.8 F (37.1 C)] 98.8 F (37.1 C) (02/02 0554) Pulse Rate:  [65-77] 65  (02/02 0554) Resp:  [16-28] 28  (02/02 0554) BP: (115-141)/(46-57) 130/52 mmHg (02/02 0554) SpO2:  [86 %-98 %] 95 % (02/02 0806)  Intake/Output from previous day: 02/01 0701 - 02/02 0700 In: 440 [P.O.:440] Out: 750 [Urine:750] Intake/Output this shift:     coarse lung sounds. Motor function intact in lower extremities.  Lab Results: No results found for this basename: WBC:2,HGB:2,HCT:2,PLT:2 in the last 72 hours BMET No results found for this basename: NA:2,K:2,CL:2,CO2:2,GLUCOSE:2,BUN:2,CREATININE:2,CALCIUM:2 in the last 72 hours  Studies/Results: No results found.  Assessment/Plan: Oversedated.  LOS: 5 days  Discontinued IV pain medication, decreased OxyIR 10 mg. Discontinue Valium. Patient has Robaxin if needed for muscle spasm. We'll monitor O2 sats.   Cherisse Carrell J 04/12/2012, 8:23 AM

## 2012-04-12 NOTE — Progress Notes (Signed)
Physical Therapy Treatment Patient Details Name: Shelby Bowman MRN: [id number] DOB: [birth year] Today's Date: 04/12/2012 Time: 1478-2956 PT Time Calculation (min): 23 min  PT Assessment / Plan / Recommendation Comments on Treatment Session  Pt very lethargic/drowsy this AM, however mobilized fairly well.  Pt's husband present entire session & assisted pt with mobility.      Follow Up Recommendations  Home health PT;Supervision/Assistance - 24 hour;Other (comment)     Does the patient have the potential to tolerate intense rehabilitation     Barriers to Discharge        Equipment Recommendations  Rolling walker with 5" wheels    Recommendations for Other Services    Frequency Min 5X/week   Plan Discharge plan remains appropriate;Other (comment)    Precautions / Restrictions Precautions Precautions: Back Precaution Comments: Reviewed back precautions Required Braces or Orthoses: Spinal Brace Spinal Brace: Lumbar corset Restrictions Weight Bearing Restrictions: No   Pertinent Vitals/Pain Pt on 4L 02 entire session.      Mobility  Bed Mobility Bed Mobility: Rolling Right;Right Sidelying to Sit;Sitting - Scoot to Edge of Bed;Sit to Sidelying Right Details for Bed Mobility Assistance: Pt's husband assisted with bed mobility.  Encouraged to allow pt to perform as (I)'ly as possible.   Transfers Transfers: Sit to Stand;Stand to Sit Sit to Stand: With upper extremity assist;From bed Stand to Sit: With upper extremity assist;To bed Details for Transfer Assistance: Cues for hand placement.  Educated pt's husband on safe positiioning to (A) pt with transfers.   Ambulation/Gait Ambulation/Gait Assistance: 4: Min guard Assistive device: Rolling walker Ambulation/Gait Assistance Details: Pt with difficulty keeping eyes open-- very drowsy.  Cues for safe manuevering of RW in hallway.  Also, cues to increase step height.   Gait Pattern: Step-to pattern;Decreased step length -  left;Decreased stance time - right;Decreased hip/knee flexion - right;Decreased hip/knee flexion - left;Antalgic Stairs: No Wheelchair Mobility Wheelchair Mobility: No      PT Goals Acute Rehab PT Goals Time For Goal Achievement: 04/15/12 Potential to Achieve Goals: Good Pt will Roll Supine to Right Side: with modified independence PT Goal: Rolling Supine to Right Side - Progress: Progressing toward goal Pt will go Supine/Side to Sit: with modified independence;with HOB 0 degrees PT Goal: Supine/Side to Sit - Progress: Progressing toward goal Pt will go Sit to Stand: with supervision;with upper extremity assist PT Goal: Sit to Stand - Progress: Progressing toward goal Pt will Ambulate: 51 - 150 feet;with supervision;with rolling walker PT Goal: Ambulate - Progress: Progressing toward goal Pt will Go Up / Down Stairs: 1-2 stairs;with supervision;with rolling walker Additional Goals Additional Goal #1: Pt to be independent with recall of 3/3 back precautions and 100% compliant. PT Goal: Additional Goal #1 - Progress: Progressing toward goal  Visit Information  Last PT Received On: 04/12/12 Assistance Needed: +1    Subjective Data      Cognition  Overall Cognitive Status: Impaired Arousal/Alertness: Lethargic Behavior During Session: Lethargic Cognition - Other Comments: Pt very lethargic/drowsy from medications per RN.      Balance  Balance Balance Assessed: No  End of Session PT - End of Session Equipment Utilized During Treatment: Gait belt;Back brace Activity Tolerance: Other (comment) (lethargy/drowsy) Patient left: in bed;with call bell/phone within reach;with family/visitor present Nurse Communication: Mobility status     Verdell Face, [place] [phone] 04/12/2012

## 2012-04-12 NOTE — Progress Notes (Signed)
Pt. Remains very lethargic.  "states she is so tired, still having difficulty staying awake.

## 2012-04-13 MED ORDER — WHITE PETROLATUM GEL
Status: AC
  Administered 2012-04-13: 04:00:00
  Filled 2012-04-13: qty 5

## 2012-04-13 NOTE — Progress Notes (Signed)
Physical Therapy Treatment Patient Details Name: Shelby Bowman MRN: [id number] DOB: 10/06/1945 Today's Date: 04/13/2012 Time: 0126-0149 PT Time Calculation (min): 23 min  PT Assessment / Plan / Recommendation Comments on Treatment Session  Pt overall moving fairly well, requiring ocassional min (A) during ambulation but requires mod cueing for increased safety awareness with activity.  Pt still drowsy & c/o significant pain in bil LE's (9/10).      Follow Up Recommendations  Home health PT;Supervision/Assistance - 24 hour;Other (comment)     Does the patient have the potential to tolerate intense rehabilitation     Barriers to Discharge        Equipment Recommendations  Rolling walker with 5" wheels    Recommendations for Other Services    Frequency Min 5X/week   Plan Discharge plan remains appropriate;Other (comment)    Precautions / Restrictions Precautions Precautions: Back Required Braces or Orthoses: Spinal Brace Spinal Brace: Lumbar corset Restrictions Weight Bearing Restrictions: No   Pertinent Vitals/Pain 9/10.  RN notified.      Mobility  Bed Mobility Bed Mobility: Rolling Left;Left Sidelying to Sit;Sitting - Scoot to Edge of Bed Rolling Left: 5: Supervision;With rail Left Sidelying to Sit: 5: Supervision;With rails;HOB flat Sitting - Scoot to Edge of Bed: 5: Supervision Details for Bed Mobility Assistance: Cues to reinforce back precautions (no twisting) Transfers Transfers: Sit to Stand;Stand to Sit Sit to Stand: 4: Min guard;With upper extremity assist;From bed;From toilet Stand to Sit: 4: Min guard;With upper extremity assist;To chair/3-in-1;To toilet Details for Transfer Assistance: cues for hand placement Ambulation/Gait Ambulation/Gait Assistance: 4: Min guard;4: Min assist Ambulation Distance (Feet): 150 Feet Assistive device: Rolling walker Ambulation/Gait Assistance Details: ocassional (A) to guide RW.  Cues for increased safety awareness of  environment due to pt drowsy.  Pt with jerky movements of RW especially during directional changes.   Gait Pattern: Step-through pattern;Antalgic;Trunk flexed;Decreased stance time - right;Decreased step length - left;Decreased hip/knee flexion - left;Decreased hip/knee flexion - right      PT Goals Acute Rehab PT Goals Time For Goal Achievement: 04/15/12 Potential to Achieve Goals: Good Pt will Roll Supine to Right Side: with modified independence Pt will Roll Supine to Left Side: with modified independence PT Goal: Rolling Supine to Left Side - Progress: Progressing toward goal Pt will go Supine/Side to Sit: with modified independence;with HOB 0 degrees PT Goal: Supine/Side to Sit - Progress: Progressing toward goal Pt will go Sit to Stand: with supervision;with upper extremity assist PT Goal: Sit to Stand - Progress: Progressing toward goal Pt will Ambulate: 51 - 150 feet;with supervision;with rolling walker PT Goal: Ambulate - Progress: Progressing toward goal Pt will Go Up / Down Stairs: 1-2 stairs;with supervision;with rolling walker Additional Goals Additional Goal #1: Pt to be independent with recall of 3/3 back precautions and 100% compliant. PT Goal: Additional Goal #1 - Progress: Progressing toward goal  Visit Information  Last PT Received On: 04/13/12 Assistance Needed: +1    Subjective Data      Cognition  Cognition Overall Cognitive Status: Impaired Area of Impairment: Safety/judgement Arousal/Alertness: Lethargic Orientation Level: Appears intact for tasks assessed Behavior During Session: Lethargic Safety/Judgement: Decreased safety judgement for tasks assessed;Decreased awareness of safety precautions Safety/Judgement - Other Comments: Cues for safety awareness with ambulation    Balance     End of Session PT - End of Session Equipment Utilized During Treatment: Gait belt;Back brace Activity Tolerance: Patient limited by pain Patient left: in chair;with  call bell/phone within reach;with family/visitor present  Verdell Face, [place] [phone] ,04/13/2012

## 2012-04-14 MED ORDER — FLEET ENEMA 7-19 GM/118ML RE ENEM: 1.0000 | ENEMA | Freq: Every day | RECTAL | Status: DC | PRN

## 2012-04-14 MED ORDER — GABAPENTIN 100 MG PO CAPS
100.0000 mg | ORAL_CAPSULE | Freq: Three times a day (TID) | ORAL | Status: DC
  Administered 2012-04-14 – 2012-04-15 (×3): 100 mg via ORAL
  Filled 2012-04-14 (×5): qty 1

## 2012-04-14 NOTE — Progress Notes (Signed)
Patient ID: Shelby Bowman, female   DOB: [birth year], 67 y.o.   MRN: [id number] Had a BM. C/o pain in both legs. Strength nl

## 2012-04-14 NOTE — Progress Notes (Signed)
Patient ID: Shelby Bowman, female   DOB: 1945-07-19, 67 y.o.   MRN: [id number] Satble, wound dry. Constipated, no weakness. See orders

## 2012-04-14 NOTE — Progress Notes (Signed)
Occupational Therapy Treatment Patient Details Name: Marne Meline MRN: [id number] DOB: Nov 24, 1945 Today's Date: 04/14/2012 Time: 8295-6213 OT Time Calculation (min): 26 min  OT Assessment / Plan / Recommendation Comments on Treatment Session Pt with improved ability to participate.  Pt. and husband both instructed in use of AE for LB ADLs, acquisition of AE, and donning/doffing brace.  Pt required no cues for back precautions this date    Follow Up Recommendations  Home health OT;Supervision/Assistance - 24 hour    Barriers to Discharge       Equipment Recommendations  None recommended by OT    Recommendations for Other Services    Frequency Min 3X/week   Plan Discharge plan remains appropriate    Precautions / Restrictions Precautions Precautions: Back Precaution Comments: Pt. required no cues for precautions Required Braces or Orthoses: Spinal Brace Spinal Brace: Lumbar corset Restrictions Weight Bearing Restrictions: No   Pertinent Vitals/Pain 10/10 bil legs    ADL  Lower Body Bathing: Simulated;Supervision/safety (with long handled sponge) Where Assessed - Lower Body Bathing: Supported sit to stand Lower Body Dressing: Minimal assistance (with AE) Where Assessed - Lower Body Dressing: Supported sit to Pharmacist, hospital: Radiographer, therapeutic Method: Sit to Barista: Comfort height toilet;Grab bars Toileting - Architect and Hygiene: Supervision/safety Where Assessed - Engineer, mining and Hygiene: Standing Equipment Used: Back brace;Rolling walker;Reacher;Long-handled sponge;Sock aid Transfers/Ambulation Related to ADLs: Pt ambulated in room with supervision ADL Comments: Husband reports he will do everything for pt. until she is able to do it herself.  Informed therapist that pt. did not need to attempt to don/doff brace as he would do it until she could do it herself.  Pt and spouse instructed in use  of AE for LB ADLs.  Husband, again informs therapist that he will do everything for pt until she is able, however, he states that he is not planning to perform for her for 8-12 wks - "until she is able", and states that he should be the one informed of how to use AE. Both pt and husband were instructed in use of AE for LB ADLs.  Pt requested to attempt to use.  Pt. able to don/doff pants, and socks with minimal assistance.  Pt. ambulated to BR.  Required assist for peri-hygiene.  Both pt and husband were instructed on acquisition of toileting aid.  Pt and husband also instructed in acquisition of AE.  Pt assisted back to bed    OT Diagnosis:    OT Problem List:   OT Treatment Interventions:     OT Goals ADL Goals ADL Goal: Lower Body Dressing - Progress: Met ADL Goal: Toilet Transfer - Progress: Met ADL Goal: Toileting - Clothing Manipulation - Progress: Progressing toward goals ADL Goal: Toileting - Hygiene - Progress: Progressing toward goals ADL Goal: Additional Goal #1 - Progress: Progressing toward goals  Visit Information  Last OT Received On: 04/14/12 Assistance Needed: +1    Subjective Data      Prior Functioning       Cognition  Cognition Overall Cognitive Status: Appears within functional limits for tasks assessed/performed Arousal/Alertness: Awake/alert Orientation Level: Appears intact for tasks assessed Behavior During Session: [facility] for tasks performed    Mobility  Bed Mobility Bed Mobility: Rolling Left;Left Sidelying to Sit;Sit to Sidelying Right;Sitting - Scoot to Edge of Bed Rolling Left: 5: Supervision;With rail Left Sidelying to Sit: 5: Supervision;With rails;HOB flat Sitting - Scoot to Edge of Bed: 5: Supervision Sit to Sidelying  Right: HOB flat;5: Supervision Transfers Transfers: Sit to Stand;Stand to Sit Sit to Stand: 5: Supervision;With upper extremity assist;With armrests;From bed;From toilet Stand to Sit: 5: Supervision;With upper extremity assist;To  bed;To toilet Details for Transfer Assistance: cues for hand placement    Exercises      Balance     End of Session OT - End of Session Equipment Utilized During Treatment: Back brace Activity Tolerance: Patient tolerated treatment well Patient left: in bed;with call bell/phone within reach;with family/visitor present  GO     Zaidyn Claire, Ursula Alert M 04/14/2012, 2:09 PM

## 2012-04-14 NOTE — Progress Notes (Signed)
Physical Therapy Treatment Patient Details Name: Shelby Bowman MRN: [id number] DOB: 01/08/1946 Today's Date: 04/14/2012 Time: 4098-1191 PT Time Calculation (min): 18 min  PT Assessment / Plan / Recommendation Comments on Treatment Session  Pt cont's to c/o significant pain in LE's Rt>Lt with activity.       Follow Up Recommendations  Home health PT;Supervision/Assistance - 24 hour;Other (comment)     Does the patient have the potential to tolerate intense rehabilitation     Barriers to Discharge        Equipment Recommendations  Rolling walker with 5" wheels    Recommendations for Other Services    Frequency Min 5X/week   Plan Discharge plan remains appropriate;Other (comment)    Precautions / Restrictions Precautions Precautions: Back Precaution Comments: Pt does well with adhering to back precautions.   Required Braces or Orthoses: Spinal Brace Spinal Brace: Lumbar corset Restrictions Weight Bearing Restrictions: No   Pertinent Vitals/Pain 8/10 Bil LE's Rt > Lt.  Heat packs applied to bil LE's.  RN notified.      Mobility  Bed Mobility Sit to Sidelying Right: HOB flat;5: Supervision Details for Bed Mobility Assistance: Cues to reinforce "No twisting" Transfers Transfers: Sit to Stand;Stand to Sit Sit to Stand: 5: Supervision;With upper extremity assist;With armrests;From chair/3-in-1 Stand to Sit: 5: Supervision;With upper extremity assist;To bed Ambulation/Gait Ambulation/Gait Assistance: 5: Supervision Ambulation Distance (Feet): 220 Feet Assistive device: Rolling walker Ambulation/Gait Assistance Details: Pt relies heavily on UE's, decreased step height.  C/o pain in Rt thigh causing antalgic gait.  Cont to require cues for increased smoothness of RW management-- improving.   Gait Pattern: Step-through pattern;Decreased stride length;Decreased stance time - right;Decreased step length - left;Decreased hip/knee flexion - left;Decreased hip/knee flexion -  right;Decreased weight shift to right;Antalgic;Trunk flexed Stairs: No      PT Goals Acute Rehab PT Goals Time For Goal Achievement: 04/15/12 Potential to Achieve Goals: Good Pt will Roll Supine to Right Side: with modified independence Pt will Roll Supine to Left Side: with modified independence Pt will go Supine/Side to Sit: with modified independence;with HOB 0 degrees Pt will go Sit to Stand: with supervision;with upper extremity assist PT Goal: Sit to Stand - Progress: Met Pt will Ambulate: 51 - 150 feet;with supervision;with rolling walker PT Goal: Ambulate - Progress: Met Pt will Go Up / Down Stairs: 1-2 stairs;with supervision;with rolling walker Additional Goals Additional Goal #1: Pt to be independent with recall of 3/3 back precautions and 100% compliant. PT Goal: Additional Goal #1 - Progress: Progressing toward goal  Visit Information  Last PT Received On: 04/14/12 Assistance Needed: +1    Subjective Data      Cognition  Cognition Overall Cognitive Status: Appears within functional limits for tasks assessed/performed Arousal/Alertness: Awake/alert Orientation Level: Appears intact for tasks assessed Behavior During Session: [facility] for tasks performed    Balance     End of Session PT - End of Session Equipment Utilized During Treatment: Gait belt;Back brace Activity Tolerance: Patient tolerated treatment well;Patient limited by pain Patient left: in bed;with call bell/phone within reach;with family/visitor present Nurse Communication: Mobility status;Other (comment) (pain )     Verdell Face, [place] [phone] 04/14/2012

## 2012-04-14 NOTE — Progress Notes (Signed)
Patient had had a BM after enema given.

## 2012-04-14 NOTE — Progress Notes (Signed)
Patient ID: Shelby Bowman, female   DOB: [birth year], 67 y.o.   MRN: [id number] PATIENT STILL I AT THE HOSPITAL. SUMMARY WILL BE DICTATED ONCE SHE GOES HOME. TOMORROW? Thursday?

## 2012-04-15 LAB — GLUCOSE, CAPILLARY: Glucose-Capillary: 155 mg/dL — ABNORMAL HIGH (ref 70–99)

## 2012-04-15 NOTE — Progress Notes (Signed)
Brief Nutrition Note:  RD pulled to pt for malnutrition screening tool score of 2, pt indicated no for both unintentional weight loss and poor appetite. A score of 2 "unknown amount" was indicated for amount of weight loss. Pt score should be 0.   Wt Readings from Last 5 Encounters:  04/08/12 153 lb (69.4 kg)  04/08/12 153 lb (69.4 kg)  03/30/12 153 lb (69.4 kg)  01/29/11 157 lb (71.215 kg)  12/27/10 157 lb 3.2 oz (71.305 kg)   Body mass index is 26.26 kg/(m^2). Overweight  Diet: Regular   No nutrition interventions are warranted at this time. Please consult as needed.   Clarene Duke RD, LDN Pager [phone] After Hours pager [phone]

## 2012-04-15 NOTE — Progress Notes (Signed)
NCM notified [facility] of pt's scheduled dc home today with HH. [facility] will deliver RW to her room.  Isidoro Donning RN CCM Case Mgmt phone [phone]

## 2012-04-15 NOTE — Discharge Summary (Signed)
Physician Discharge Summary  Patient ID: Shelby Bowman MRN: [id number] DOB/AGE: Dec 17, 1945 67 y.o.  Admit date: 04/07/2012 Discharge date: 04/15/2012  Admission Diagnoses:lumbar degenerative sdisc disease Discharge Diagnoses: same Principal Problem:  *Lumbar scoliosisL2-S1   Discharged Condition: stable Hospital Course: surgery  Consults: rehab medicine Significant Diagnostic Studies:myelogram  Treatments: lumbar fusion  Discharge Exam: Blood pressure 142/67, pulse 75, temperature 98.9 F (37.2 C), temperature source Oral, resp. rate 18, height 5\' 4"  (1.626 m), weight 69.4 kg (153 lb), SpO2 98.00%. Wound dry. ambulating Disposition: home     Medication List     As of 04/15/2012 11:34 AM    ASK your doctor about these medications         albuterol (2.5 MG/3ML) 0.083% nebulizer solution   Commonly known as: PROVENTIL   Take 2.5 mg by nebulization every 6 (six) hours as needed. For shortness of breath/wheezing      alendronate 70 MG tablet   Commonly known as: FOSAMAX   Take 70 mg by mouth every 7 (seven) days. Take with a full glass of water on an empty stomach.  On Monday      beclomethasone 80 MCG/ACT inhaler   Commonly known as: QVAR   Inhale 2 puffs into the lungs 2 (two) times daily.      DULoxetine 30 MG capsule   Commonly known as: CYMBALTA   Take 30 mg by mouth daily.      fentaNYL 12 MCG/HR   Commonly known as: DURAGESIC - dosed mcg/hr   Place 1 patch onto the skin every 3 (three) days.      fluticasone 50 MCG/ACT nasal spray   Commonly known as: FLONASE   Place 2 sprays into the nose 2 (two) times daily.      hydrochlorothiazide 25 MG tablet   Commonly known as: HYDRODIURIL   Take 25 mg by mouth daily.      levocetirizine 5 MG tablet   Commonly known as: XYZAL   Take 5 mg by mouth every evening.      levothyroxine 75 MCG tablet   Commonly known as: SYNTHROID, LEVOTHROID   Take 75 mcg by mouth daily.      losartan-hydrochlorothiazide 50-12.5  MG per tablet   Commonly known as: HYZAAR   Take 1 tablet by mouth daily.      mometasone-formoterol 200-5 MCG/ACT Aero   Commonly known as: DULERA   Inhale 2 puffs into the lungs 2 (two) times daily.      montelukast 10 MG tablet   Commonly known as: SINGULAIR   Take 10 mg by mouth at bedtime.      multivitamin with minerals Tabs   Take 1 tablet by mouth daily.      nabumetone 750 MG tablet   Commonly known as: RELAFEN   Take 750 mg by mouth 2 (two) times daily.      pantoprazole 40 MG tablet   Commonly known as: PROTONIX   Take 40 mg by mouth daily.      pravastatin 20 MG tablet   Commonly known as: PRAVACHOL   Take 20 mg by mouth daily.      VIACTIV MULTI-VITAMIN PO   Take 2 capsules by mouth 2 (two) times daily.         Signed: Karn Cassis 04/15/2012, 11:34 AM

## 2013-04-09 ENCOUNTER — Other Ambulatory Visit: Payer: Self-pay | Admitting: Neurosurgery

## 2013-04-19 ENCOUNTER — Encounter (HOSPITAL_COMMUNITY): Payer: Self-pay

## 2013-04-21 ENCOUNTER — Encounter (HOSPITAL_COMMUNITY)
Admission: RE | Admit: 2013-04-21 | Discharge: 2013-04-21 | Disposition: A | Discharge status: Home / Self Care | Payer: Medicare Other | Source: Ambulatory Visit | Attending: Neurosurgery | Admitting: Neurosurgery

## 2013-04-21 ENCOUNTER — Encounter (HOSPITAL_COMMUNITY): Payer: Self-pay

## 2013-04-21 ENCOUNTER — Encounter (HOSPITAL_COMMUNITY)
Admission: RE | Admit: 2013-04-21 | Discharge: 2013-04-21 | Disposition: A | Discharge status: Home / Self Care | Payer: Medicare Other | Source: Ambulatory Visit | Attending: Anesthesiology | Admitting: Anesthesiology

## 2013-04-21 ENCOUNTER — Other Ambulatory Visit (HOSPITAL_COMMUNITY): Payer: Self-pay | Admitting: *Deleted

## 2013-04-21 DIAGNOSIS — Z01812 Encounter for preprocedural laboratory examination: Secondary | ICD-10-CM | POA: Insufficient documentation

## 2013-04-21 DIAGNOSIS — Z01818 Encounter for other preprocedural examination: Secondary | ICD-10-CM | POA: Insufficient documentation

## 2013-04-21 DIAGNOSIS — Z01810 Encounter for preprocedural cardiovascular examination: Secondary | ICD-10-CM | POA: Insufficient documentation

## 2013-04-21 HISTORY — DX: Shortness of breath: R06.02

## 2013-04-21 HISTORY — DX: Gastro-esophageal reflux disease without esophagitis: K21.9

## 2013-04-21 LAB — CBC
HEMATOCRIT: 37.1 % (ref 36.0–46.0)
HEMOGLOBIN: 12.6 g/dL (ref 12.0–15.0)
MCH: 28.3 pg (ref 26.0–34.0)
MCHC: 34 g/dL (ref 30.0–36.0)
MCV: 83.2 fL (ref 78.0–100.0)
Platelets: 264 10*3/uL (ref 150–400)
RBC: 4.46 MIL/uL (ref 3.87–5.11)
RDW: 15.3 % (ref 11.5–15.5)
WBC: 9.2 10*3/uL (ref 4.0–10.5)

## 2013-04-21 LAB — BASIC METABOLIC PANEL
BUN: 21 mg/dL (ref 6–23)
CHLORIDE: 98 meq/L (ref 96–112)
CO2: 23 mEq/L (ref 19–32)
CREATININE: 0.74 mg/dL (ref 0.50–1.10)
Calcium: 9.5 mg/dL (ref 8.4–10.5)
GFR calc Af Amer: >90 mL/min (ref >90)
GFR, EST NON AFRICAN AMERICAN: 86 mL/min — AB (ref >90)
GLUCOSE: 130 mg/dL — AB (ref 70–99)
POTASSIUM: 3.9 meq/L (ref 3.7–5.3)
Sodium: 138 mEq/L (ref 137–147)

## 2013-04-21 LAB — SURGICAL PCR SCREEN
MRSA, PCR: POSITIVE — AB
Staphylococcus aureus: POSITIVE — AB

## 2013-04-21 NOTE — Progress Notes (Signed)
Rx for Mupirocin Ointment called into [facility] [address] per pt request. Left message for pt that her nasal swab was positive for MRSA and Staph.

## 2013-04-21 NOTE — Pre-Procedure Instructions (Signed)
Shelby Bowman  04/21/2013   Your procedure is scheduled on:  Wednesday, April 28, 2013 at 12:50 PM.   Report to [facility] Entrance "A" Admitting Office at 9:50 AM.   Call this number if you have problems the morning of surgery: [phone]   Remember:   Do not eat food or drink liquids after midnight Tuesday, 04/27/13.   Take these medicines the morning of surgery with A SIP OF WATER: DULoxetine (CYMBALTA), levothyroxine (SYNTHROID, LEVOTHROID), pantoprazole (PROTONIX), mometasone-formoterol (DULERA) inhaler, fluticasone (FLONASE),  HYDROcodone-acetaminophen (NORCO) - if needed, albuterol (PROVENTIL) inhaler - if needed (Please bring inhaler with you day of surgery).    Do not wear jewelry, make-up or nail polish.  Do not wear lotions, powders, or perfumes. You may wear deodorant.  Do not shave 48 hours prior to surgery.   Do not bring valuables to the hospital.  [facility] is not responsible                  for any belongings or valuables.               Contacts, dentures or bridgework may not be worn into surgery.  Leave suitcase in the car. After surgery it may be brought to your room.  For patients admitted to the hospital, discharge time is determined by your                treatment team.           Special Instructions: [name] - Preparing for Surgery  Before surgery, you can play an important role.  Because skin is not sterile, your skin needs to be as free of germs as possible.  You can reduce the number of germs on you skin by washing with CHG (chlorahexidine gluconate) soap before surgery.  CHG is an antiseptic cleaner which kills germs and bonds with the skin to continue killing germs even after washing.  Please DO NOT use if you have an allergy to CHG or antibacterial soaps.  If your skin becomes reddened/irritated stop using the CHG and inform your nurse when you arrive at Short Stay.  Do not shave (including legs and underarms) for at least 48 hours  prior to the first CHG shower.  You may shave your face.  Please follow these instructions carefully:   1.  Shower with CHG Soap the night before surgery and the                                morning of Surgery.  2.  If you choose to wash your hair, wash your hair first as usual with your       normal shampoo.  3.  After you shampoo, rinse your hair and body thoroughly to remove the                      Shampoo.  4.  Use CHG as you would any other liquid soap.  You can apply chg directly       to the skin and wash gently with scrungie or a clean washcloth.  5.  Apply the CHG Soap to your body ONLY FROM THE NECK DOWN.        Do not use on open wounds or open sores.  Avoid contact with your eyes,       ears, mouth and genitals (private parts).  Wash genitals (private  parts)       with your normal soap.  6.  Wash thoroughly, paying special attention to the area where your surgery        will be performed.  7.  Thoroughly rinse your body with warm water from the neck down.  8.  DO NOT shower/wash with your normal soap after using and rinsing off       the CHG Soap.  9.  Pat yourself dry with a clean towel.            10.  Wear clean pajamas.            11.  Place clean sheets on your bed the night of your first shower and do not        sleep with pets.  Day of Surgery  Do not apply any lotions/deoderants the morning of surgery.  Please wear clean clothes to the hospital/surgery center.     Please read over the following fact sheets that you were given: Pain Booklet, Coughing and Deep Breathing, MRSA Information and Surgical Site Infection Prevention

## 2013-04-21 NOTE — Pre-Procedure Instructions (Signed)
Shelby Bowman  04/21/2013   Your procedure is scheduled on:  Wednesday, April 28, 2013 at 12:50 PM.   Report to [facility] Entrance "A" Admitting Office at 9:50 AM.   Call this number if you have problems the morning of surgery: [phone]   Remember:   Do not eat food or drink liquids after midnight Tuesday, 04/27/13.   Take these medicines the morning of surgery with A SIP OF WATER: DULoxetine (CYMBALTA), levothyroxine (SYNTHROID, LEVOTHROID), pantoprazole (PROTONIX), mometasone-formoterol (DULERA) inhaler, fluticasone (FLONASE),  HYDROcodone-acetaminophen (NORCO) - if needed, albuterol (PROVENTIL) inhaler - if needed (Please bring inhaler with you day of surgery).    Do not wear jewelry, make-up or nail polish.  Do not wear lotions, powders, or perfumes. You may wear deodorant.  Do not shave 48 hours prior to surgery.   Do not bring valuables to the hospital.  [facility] is not responsible                  for any belongings or valuables.               Contacts, dentures or bridgework may not be worn into surgery.  Leave suitcase in the car. After surgery it may be brought to your room.  For patients admitted to the hospital, discharge time is determined by your                treatment team.           Special Instructions: [place] - Preparing for Surgery  Before surgery, you can play an important role.  Because skin is not sterile, your skin needs to be as free of germs as possible.  You can reduce the number of germs on you skin by washing with CHG (chlorahexidine gluconate) soap before surgery.  CHG is an antiseptic cleaner which kills germs and bonds with the skin to continue killing germs even after washing.  Please DO NOT use if you have an allergy to CHG or antibacterial soaps.  If your skin becomes reddened/irritated stop using the CHG and inform your nurse when you arrive at Short Stay.  Do not shave (including legs and underarms) for at least 48 hours  prior to the first CHG shower.  You may shave your face.  Please follow these instructions carefully:   1.  Shower with CHG Soap the night before surgery and the                                morning of Surgery.  2.  If you choose to wash your hair, wash your hair first as usual with your       normal shampoo.  3.  After you shampoo, rinse your hair and body thoroughly to remove the                      Shampoo.  4.  Use CHG as you would any other liquid soap.  You can apply chg directly       to the skin and wash gently with scrungie or a clean washcloth.  5.  Apply the CHG Soap to your body ONLY FROM THE NECK DOWN.        Do not use on open wounds or open sores.  Avoid contact with your eyes, ears, mouth and genitals (private parts).  Wash genitals (private parts) with your normal soap.  6.  Wash thoroughly, paying special attention to the area where your surgery        will be performed.  7.  Thoroughly rinse your body with warm water from the neck down.  8.  DO NOT shower/wash with your normal soap after using and rinsing off       the CHG Soap.  9.  Pat yourself dry with a clean towel.            10.  Wear clean pajamas.            11.  Place clean sheets on your bed the night of your first shower and do not        sleep with pets.  Day of Surgery  Do not apply any lotions the morning of surgery.  Please wear clean clothes to the hospital/surgery center.     Please read over the following fact sheets that you were given: Pain Booklet, Coughing and Deep Breathing, MRSA Information and Surgical Site Infection Prevention

## 2013-04-21 NOTE — Pre-Procedure Instructions (Signed)
Shelby Bowman  04/21/2013   Your procedure is scheduled on:  Wednesday, April 28, 2013 at 12:50 PM.   Report to [facility] Entrance "A" Admitting Office at 9:50 AM.   Call this number if you have problems the morning of surgery: [phone]   Remember:   Do not eat food or drink liquids after midnight Tuesday, 04/27/13.   Take these medicines the morning of surgery with A SIP OF WATER: DULoxetine (CYMBALTA), levothyroxine (SYNTHROID, LEVOTHROID), pantoprazole (PROTONIX), mometasone-formoterol (DULERA) inhaler, fluticasone (FLONASE),  HYDROcodone-acetaminophen (NORCO) - if needed, albuterol (PROVENTIL) inhaler - if needed (Please bring inhaler with you day of surgery).  Stop all Vitamins and NSAIDS (Relafen, Ibuprofen, Aleve, etc) as of today.    Do not wear jewelry, make-up or nail polish.  Do not wear lotions, powders, or perfumes. You may wear deodorant.  Do not shave 48 hours prior to surgery.   Do not bring valuables to the hospital.  [facility] is not responsible                  for any belongings or valuables.               Contacts, dentures or bridgework may not be worn into surgery.  Leave suitcase in the car. After surgery it may be brought to your room.  For patients admitted to the hospital, discharge time is determined by your                treatment team.           Special Instructions: [place] - Preparing for Surgery  Before surgery, you can play an important role.  Because skin is not sterile, your skin needs to be as free of germs as possible.  You can reduce the number of germs on you skin by washing with CHG (chlorahexidine gluconate) soap before surgery.  CHG is an antiseptic cleaner which kills germs and bonds with the skin to continue killing germs even after washing.  Please DO NOT use if you have an allergy to CHG or antibacterial soaps.  If your skin becomes reddened/irritated stop using the CHG and inform your nurse when you arrive at  Short Stay.  Do not shave (including legs and underarms) for at least 48 hours prior to the first CHG shower.  You may shave your face.  Please follow these instructions carefully:   1.  Shower with CHG Soap the night before surgery and the                                morning of Surgery.  2.  If you choose to wash your hair, wash your hair first as usual with your       normal shampoo.  3.  After you shampoo, rinse your hair and body thoroughly to remove the                      Shampoo.  4.  Use CHG as you would any other liquid soap.  You can apply chg directly       to the skin and wash gently with scrungie or a clean washcloth.  5.  Apply the CHG Soap to your body ONLY FROM THE NECK DOWN.        Do not use on open wounds or open sores.  Avoid contact with your eyes, ears, mouth and  genitals (private parts).  Wash genitals (private parts) with your normal soap.  6.  Wash thoroughly, paying special attention to the area where your surgery        will be performed.  7.  Thoroughly rinse your body with warm water from the neck down.  8.  DO NOT shower/wash with your normal soap after using and rinsing off       the CHG Soap.  9.  Pat yourself dry with a clean towel.            10.  Wear clean pajamas.            11.  Place clean sheets on your bed the night of your first shower and do not        sleep with pets.  Day of Surgery  Do not apply any lotions the morning of surgery.  Please wear clean clothes to the hospital/surgery center.     Please read over the following fact sheets that you were given: Pain Booklet, Coughing and Deep Breathing, MRSA Information and Surgical Site Infection Prevention

## 2013-04-27 MED ORDER — CEFAZOLIN SODIUM-DEXTROSE 2-3 GM-% IV SOLR
2.0000 g | INTRAVENOUS | Status: AC
  Administered 2013-04-28: 2 g via INTRAVENOUS
  Filled 2013-04-27: qty 50

## 2013-04-27 NOTE — H&P (Signed)
Shelby Bowman is an 68 y.o. female.   Chief Complaint: neck pain and weakness in arms HPI: patient seen by me after lumbar decompression, complaining of neck pain with mobility, associated with radiation to both upper extremities with sensory changes, which is getting worse. Lumbar wise she is doing great  Past Medical History  Diagnosis Date  . Hyperlipidemia   . Osteoporosis   . Allergic rhinitis     "states on protonix for allegices per allergist"  . Hypothyroidism   . Diverticulosis   . Cellulitis of breast   . Adenomatous colon polyp 2007  . Hypertension     sees Eldridge Abrahams, pa  . Anxiety   . Asthma     sees Dr. Neldon Mc  . Pneumonia     hx of, and hx of bronchitis  . Arthritis   . Macular degeneration     left eye  . Chronic neck pain     hx of  . Chronic back pain     hx of  . Shortness of breath     with exertion and weather   . GERD (gastroesophageal reflux disease)     Past Surgical History  Procedure Laterality Date  . Breast biopsy  1980's    Benign Right  . Total hip arthroplasty      x2  . Posterior lumbar fusion 4 level  04/07/2012    Procedure: POSTERIOR LUMBAR FUSION 4 LEVEL;  Surgeon: Floyce Stakes, MD;  Location: [facility];  Service: Neurosurgery;  Laterality: N/A;  Lumbar Two-Three Lumbar Three-Four Lumbar Four-Five Lumbar Five-Sacral One, Diskectomy/cages/Pedicle screws/Posterolateral arthrodesis/Cellsaver  . Back surgery    . Dilation and curettage of uterus    . Tubal ligation      Family History  Problem Relation Age of Onset  . Hyperlipidemia Son   . Diabetes Son   . Cancer Maternal Grandmother   . Hypertension Brother   . Kidney failure Father   . Prostate cancer Paternal Grandfather   . Heart failure Mother   . Colon cancer Neg Hx    Social History:  reports that she quit smoking about 22 years ago. She has never used smokeless tobacco. She reports that she does not drink alcohol or use illicit drugs.  Allergies:  Allergies   Allergen Reactions  . Other Dermatitis    SILK  TAPE . PATIENT HAD A BLISTER TYPE RASH.   . Valium [Diazepam]     Goes nuts; does not know anything    No prescriptions prior to admission    No results found for this or any previous visit (from the past 48 hour(s)). No results found.  Review of Systems  Constitutional: Negative.   Eyes: Negative.   Respiratory: Negative.   Cardiovascular: Negative.   Gastrointestinal: Negative.   Genitourinary: Negative.   Musculoskeletal: Positive for back pain and neck pain.  Skin: Negative.   Neurological: Positive for sensory change and focal weakness.  Psychiatric/Behavioral: Negative.     There were no vitals taken for this visit. Physical Exam hent, nl. Neck, decrease of mobility with pain with it. Cv, nl. Lungs, clear. Abdomen, soft. Extremities, scar in both hips ae well as lumbar. Neuro, weakness of deltois, biceps and wrist extensors. Dtr, nl. Sensory normal but complains of some burning in the upper extremities. Myelo gram of the cervical spine shows stenosis from c3 to c7  Assessment/Plan Decompression and fusion from c3 to c7. She is aware of risks and benefits  Demica Zook M  04/27/2013, 5:39 PM

## 2013-04-28 ENCOUNTER — Encounter (HOSPITAL_COMMUNITY): Payer: Medicare Other | Admitting: Anesthesiology

## 2013-04-28 ENCOUNTER — Encounter (HOSPITAL_COMMUNITY)
Admission: RE | Disposition: A | Discharge status: Home / Self Care | Payer: Self-pay | Source: Ambulatory Visit | Attending: Neurosurgery

## 2013-04-28 ENCOUNTER — Inpatient Hospital Stay (HOSPITAL_COMMUNITY)
Admission: RE | Admit: 2013-04-28 | Discharge: 2013-04-30 | DRG: 473 | Disposition: A | Discharge status: Home / Self Care | Payer: Medicare Other | Source: Ambulatory Visit | Attending: Neurosurgery | Admitting: Neurosurgery

## 2013-04-28 ENCOUNTER — Encounter (HOSPITAL_COMMUNITY): Payer: Self-pay | Admitting: *Deleted

## 2013-04-28 ENCOUNTER — Inpatient Hospital Stay (HOSPITAL_COMMUNITY): Payer: Medicare Other

## 2013-04-28 ENCOUNTER — Inpatient Hospital Stay (HOSPITAL_COMMUNITY): Payer: Medicare Other | Admitting: Anesthesiology

## 2013-04-28 DIAGNOSIS — Z8249 Family history of ischemic heart disease and other diseases of the circulatory system: Secondary | ICD-10-CM

## 2013-04-28 DIAGNOSIS — H3530 Unspecified macular degeneration: Secondary | ICD-10-CM | POA: Diagnosis present

## 2013-04-28 DIAGNOSIS — Z860100 Personal history of colon polyps, unspecified: Secondary | ICD-10-CM

## 2013-04-28 DIAGNOSIS — Z96649 Presence of unspecified artificial hip joint: Secondary | ICD-10-CM

## 2013-04-28 DIAGNOSIS — E785 Hyperlipidemia, unspecified: Secondary | ICD-10-CM | POA: Diagnosis present

## 2013-04-28 DIAGNOSIS — Z981 Arthrodesis status: Secondary | ICD-10-CM

## 2013-04-28 DIAGNOSIS — M4802 Spinal stenosis, cervical region: Secondary | ICD-10-CM | POA: Diagnosis present

## 2013-04-28 DIAGNOSIS — Z79899 Other long term (current) drug therapy: Secondary | ICD-10-CM

## 2013-04-28 DIAGNOSIS — Z9851 Tubal ligation status: Secondary | ICD-10-CM

## 2013-04-28 DIAGNOSIS — E039 Hypothyroidism, unspecified: Secondary | ICD-10-CM | POA: Diagnosis present

## 2013-04-28 DIAGNOSIS — M810 Age-related osteoporosis without current pathological fracture: Secondary | ICD-10-CM | POA: Diagnosis present

## 2013-04-28 DIAGNOSIS — Z87891 Personal history of nicotine dependence: Secondary | ICD-10-CM

## 2013-04-28 DIAGNOSIS — K5730 Diverticulosis of large intestine without perforation or abscess without bleeding: Secondary | ICD-10-CM | POA: Diagnosis present

## 2013-04-28 DIAGNOSIS — K219 Gastro-esophageal reflux disease without esophagitis: Secondary | ICD-10-CM | POA: Diagnosis present

## 2013-04-28 DIAGNOSIS — Z833 Family history of diabetes mellitus: Secondary | ICD-10-CM

## 2013-04-28 DIAGNOSIS — G8929 Other chronic pain: Secondary | ICD-10-CM | POA: Diagnosis present

## 2013-04-28 DIAGNOSIS — I10 Essential (primary) hypertension: Secondary | ICD-10-CM | POA: Diagnosis present

## 2013-04-28 DIAGNOSIS — J45909 Unspecified asthma, uncomplicated: Secondary | ICD-10-CM | POA: Diagnosis present

## 2013-04-28 DIAGNOSIS — Z841 Family history of disorders of kidney and ureter: Secondary | ICD-10-CM

## 2013-04-28 DIAGNOSIS — M5030 Other cervical disc degeneration, unspecified cervical region: Principal | ICD-10-CM | POA: Diagnosis present

## 2013-04-28 DIAGNOSIS — F411 Generalized anxiety disorder: Secondary | ICD-10-CM | POA: Diagnosis present

## 2013-04-28 HISTORY — PX: ANTERIOR CERVICAL DECOMPRESSION/DISCECTOMY FUSION 4 LEVELS: SHX5556

## 2013-04-28 SURGERY — ANTERIOR CERVICAL DECOMPRESSION/DISCECTOMY FUSION 4 LEVELS
Anesthesia: General

## 2013-04-28 MED ORDER — ROCURONIUM BROMIDE 50 MG/5ML IV SOLN
INTRAVENOUS | Status: AC
  Filled 2013-04-28: qty 1

## 2013-04-28 MED ORDER — PHENOL 1.4 % MT LIQD
1.0000 | OROMUCOSAL | Status: DC | PRN
  Filled 2013-04-28: qty 177

## 2013-04-28 MED ORDER — ALBUMIN HUMAN 5 % IV SOLN
INTRAVENOUS | Status: DC | PRN
  Administered 2013-04-28: 15:00:00 via INTRAVENOUS

## 2013-04-28 MED ORDER — MONTELUKAST SODIUM 10 MG PO TABS
10.0000 mg | ORAL_TABLET | Freq: Every day | ORAL | Status: DC
  Administered 2013-04-28 – 2013-04-29 (×2): 10 mg via ORAL
  Filled 2013-04-28 (×3): qty 1

## 2013-04-28 MED ORDER — LOSARTAN POTASSIUM-HCTZ 50-12.5 MG PO TABS: 1.0000 | ORAL_TABLET | Freq: Every day | ORAL | Status: DC

## 2013-04-28 MED ORDER — ONDANSETRON HCL 4 MG/2ML IJ SOLN: 4.0000 mg | INTRAMUSCULAR | Status: DC | PRN

## 2013-04-28 MED ORDER — CHLORHEXIDINE GLUCONATE CLOTH 2 % EX PADS
6.0000 | MEDICATED_PAD | Freq: Every day | CUTANEOUS | Status: DC
  Administered 2013-04-29 – 2013-04-30 (×2): 6 via TOPICAL

## 2013-04-28 MED ORDER — FENTANYL CITRATE 0.05 MG/ML IJ SOLN
INTRAMUSCULAR | Status: DC | PRN
  Administered 2013-04-28: 100 ug via INTRAVENOUS
  Administered 2013-04-28: 150 ug via INTRAVENOUS
  Administered 2013-04-28 (×3): 50 ug via INTRAVENOUS

## 2013-04-28 MED ORDER — HYDROCHLOROTHIAZIDE 25 MG PO TABS: 25.0000 mg | ORAL_TABLET | Freq: Every day | ORAL | Status: DC

## 2013-04-28 MED ORDER — DULOXETINE HCL 30 MG PO CPEP
30.0000 mg | ORAL_CAPSULE | Freq: Every day | ORAL | Status: DC
  Administered 2013-04-29 – 2013-04-30 (×2): 30 mg via ORAL
  Filled 2013-04-28 (×2): qty 1

## 2013-04-28 MED ORDER — ONDANSETRON HCL 4 MG/2ML IJ SOLN: 4.0000 mg | Freq: Four times a day (QID) | INTRAMUSCULAR | Status: DC | PRN

## 2013-04-28 MED ORDER — MENTHOL 3 MG MT LOZG
1.0000 | LOZENGE | OROMUCOSAL | Status: DC | PRN
  Filled 2013-04-28: qty 9

## 2013-04-28 MED ORDER — ROCURONIUM BROMIDE 100 MG/10ML IV SOLN
INTRAVENOUS | Status: DC | PRN
  Administered 2013-04-28: 50 mg via INTRAVENOUS
  Administered 2013-04-28: 10 mg via INTRAVENOUS

## 2013-04-28 MED ORDER — THROMBIN 5000 UNITS EX SOLR
OROMUCOSAL | Status: DC | PRN
  Administered 2013-04-28: 15:00:00 via TOPICAL

## 2013-04-28 MED ORDER — LACTATED RINGERS IV SOLN
INTRAVENOUS | Status: DC
  Administered 2013-04-28: 10:00:00 via INTRAVENOUS

## 2013-04-28 MED ORDER — ONDANSETRON HCL 4 MG/2ML IJ SOLN
INTRAMUSCULAR | Status: AC
Start: 2013-04-28 — End: 2013-04-28
  Filled 2013-04-28: qty 2

## 2013-04-28 MED ORDER — PROPOFOL 10 MG/ML IV BOLUS
INTRAVENOUS | Status: DC | PRN
  Administered 2013-04-28: 150 mg via INTRAVENOUS
  Administered 2013-04-28: 50 mg via INTRAVENOUS

## 2013-04-28 MED ORDER — OXYCODONE-ACETAMINOPHEN 5-325 MG PO TABS
1.0000 | ORAL_TABLET | ORAL | Status: DC | PRN
  Administered 2013-04-28 – 2013-04-30 (×7): 2 via ORAL
  Filled 2013-04-28 (×7): qty 2

## 2013-04-28 MED ORDER — 0.9 % SODIUM CHLORIDE (POUR BTL) OPTIME
TOPICAL | Status: DC | PRN
  Administered 2013-04-28: 1000 mL

## 2013-04-28 MED ORDER — SURGIFOAM 100 EX MISC
CUTANEOUS | Status: DC | PRN
  Administered 2013-04-28: 15:00:00 via TOPICAL

## 2013-04-28 MED ORDER — GLYCOPYRROLATE 0.2 MG/ML IJ SOLN
INTRAMUSCULAR | Status: AC
  Filled 2013-04-28: qty 1

## 2013-04-28 MED ORDER — SODIUM CHLORIDE 0.9 % IV SOLN: INTRAVENOUS | Status: DC

## 2013-04-28 MED ORDER — NEOSTIGMINE METHYLSULFATE 1 MG/ML IJ SOLN
INTRAMUSCULAR | Status: DC | PRN
  Administered 2013-04-28: 4 mg via INTRAVENOUS

## 2013-04-28 MED ORDER — LACTATED RINGERS IV SOLN
INTRAVENOUS | Status: DC
  Administered 2013-04-28 (×2): via INTRAVENOUS

## 2013-04-28 MED ORDER — HYDROMORPHONE HCL PF 1 MG/ML IJ SOLN
INTRAMUSCULAR | Status: AC
  Filled 2013-04-28: qty 1

## 2013-04-28 MED ORDER — CYCLOBENZAPRINE HCL 10 MG PO TABS
ORAL_TABLET | ORAL | Status: AC
  Filled 2013-04-28: qty 1

## 2013-04-28 MED ORDER — CYCLOBENZAPRINE HCL 10 MG PO TABS
10.0000 mg | ORAL_TABLET | Freq: Three times a day (TID) | ORAL | Status: DC
  Administered 2013-04-28 – 2013-04-30 (×7): 10 mg via ORAL
  Filled 2013-04-28 (×8): qty 1

## 2013-04-28 MED ORDER — FENTANYL CITRATE 0.05 MG/ML IJ SOLN
INTRAMUSCULAR | Status: AC
  Filled 2013-04-28: qty 5

## 2013-04-28 MED ORDER — SODIUM CHLORIDE 0.9 % IJ SOLN: 3.0000 mL | INTRAMUSCULAR | Status: DC | PRN

## 2013-04-28 MED ORDER — MIDAZOLAM HCL 5 MG/5ML IJ SOLN
INTRAMUSCULAR | Status: DC | PRN
  Administered 2013-04-28: 2 mg via INTRAVENOUS

## 2013-04-28 MED ORDER — ACETAMINOPHEN 650 MG RE SUPP: 650.0000 mg | RECTAL | Status: DC | PRN

## 2013-04-28 MED ORDER — DEXAMETHASONE SODIUM PHOSPHATE 10 MG/ML IJ SOLN
INTRAMUSCULAR | Status: AC
  Filled 2013-04-28: qty 1

## 2013-04-28 MED ORDER — MORPHINE SULFATE 2 MG/ML IJ SOLN: 1.0000 mg | INTRAMUSCULAR | Status: DC | PRN

## 2013-04-28 MED ORDER — LIDOCAINE HCL (CARDIAC) 20 MG/ML IV SOLN
INTRAVENOUS | Status: DC | PRN
  Administered 2013-04-28: 100 mg via INTRAVENOUS

## 2013-04-28 MED ORDER — ACETAMINOPHEN 325 MG PO TABS: 650.0000 mg | ORAL_TABLET | ORAL | Status: DC | PRN

## 2013-04-28 MED ORDER — ZOLPIDEM TARTRATE 5 MG PO TABS: 5.0000 mg | ORAL_TABLET | Freq: Every evening | ORAL | Status: DC | PRN

## 2013-04-28 MED ORDER — GLYCOPYRROLATE 0.2 MG/ML IJ SOLN
INTRAMUSCULAR | Status: DC | PRN
  Administered 2013-04-28: .8 mg via INTRAVENOUS
  Administered 2013-04-28: 0.2 mg via INTRAVENOUS

## 2013-04-28 MED ORDER — VANCOMYCIN HCL IN DEXTROSE 1-5 GM/200ML-% IV SOLN
INTRAVENOUS | Status: AC
  Administered 2013-04-28: 1000 mg via INTRAVENOUS
  Filled 2013-04-28: qty 200

## 2013-04-28 MED ORDER — CEFAZOLIN SODIUM 1-5 GM-% IV SOLN
1.0000 g | Freq: Three times a day (TID) | INTRAVENOUS | Status: AC
  Administered 2013-04-28 – 2013-04-29 (×2): 1 g via INTRAVENOUS
  Filled 2013-04-28 (×2): qty 50

## 2013-04-28 MED ORDER — OXYCODONE HCL 5 MG PO TABS
ORAL_TABLET | ORAL | Status: AC
  Filled 2013-04-28: qty 1

## 2013-04-28 MED ORDER — SODIUM CHLORIDE 0.9 % IJ SOLN
3.0000 mL | Freq: Two times a day (BID) | INTRAMUSCULAR | Status: DC
  Administered 2013-04-28 – 2013-04-30 (×4): 3 mL via INTRAVENOUS

## 2013-04-28 MED ORDER — EPHEDRINE SULFATE 50 MG/ML IJ SOLN
INTRAMUSCULAR | Status: DC | PRN
  Administered 2013-04-28 (×2): 5 mg via INTRAVENOUS

## 2013-04-28 MED ORDER — FLUTICASONE PROPIONATE 50 MCG/ACT NA SUSP
2.0000 | Freq: Two times a day (BID) | NASAL | Status: DC
  Administered 2013-04-28 – 2013-04-30 (×3): 2 via NASAL
  Filled 2013-04-28: qty 16

## 2013-04-28 MED ORDER — HYDROCHLOROTHIAZIDE 12.5 MG PO CAPS
37.5000 mg | ORAL_CAPSULE | Freq: Every day | ORAL | Status: DC
  Administered 2013-04-29 – 2013-04-30 (×2): 37.5 mg via ORAL
  Filled 2013-04-28 (×2): qty 3

## 2013-04-28 MED ORDER — LEVOTHYROXINE SODIUM 75 MCG PO TABS
75.0000 ug | ORAL_TABLET | Freq: Every day | ORAL | Status: DC
  Administered 2013-04-29 – 2013-04-30 (×2): 75 ug via ORAL
  Filled 2013-04-28 (×3): qty 1

## 2013-04-28 MED ORDER — ONDANSETRON HCL 4 MG/2ML IJ SOLN
INTRAMUSCULAR | Status: DC | PRN
  Administered 2013-04-28: 4 mg via INTRAVENOUS

## 2013-04-28 MED ORDER — SODIUM CHLORIDE 0.9 % IJ SOLN
INTRAMUSCULAR | Status: AC
  Filled 2013-04-28: qty 10

## 2013-04-28 MED ORDER — MOMETASONE FURO-FORMOTEROL FUM 200-5 MCG/ACT IN AERO
2.0000 | INHALATION_SPRAY | Freq: Two times a day (BID) | RESPIRATORY_TRACT | Status: DC
  Administered 2013-04-28 – 2013-04-30 (×4): 2 via RESPIRATORY_TRACT
  Filled 2013-04-28: qty 8.8

## 2013-04-28 MED ORDER — GLYCOPYRROLATE 0.2 MG/ML IJ SOLN
INTRAMUSCULAR | Status: AC
  Filled 2013-04-28: qty 4

## 2013-04-28 MED ORDER — HYDROMORPHONE HCL PF 1 MG/ML IJ SOLN
0.2500 mg | INTRAMUSCULAR | Status: DC | PRN
  Administered 2013-04-28 (×4): 0.5 mg via INTRAVENOUS

## 2013-04-28 MED ORDER — DEXAMETHASONE SODIUM PHOSPHATE 4 MG/ML IJ SOLN
INTRAMUSCULAR | Status: DC | PRN
  Administered 2013-04-28: 10 mg via INTRAVENOUS

## 2013-04-28 MED ORDER — NEOSTIGMINE METHYLSULFATE 1 MG/ML IJ SOLN
INTRAMUSCULAR | Status: AC
  Filled 2013-04-28: qty 10

## 2013-04-28 MED ORDER — EPHEDRINE SULFATE 50 MG/ML IJ SOLN
INTRAMUSCULAR | Status: AC
  Filled 2013-04-28: qty 1

## 2013-04-28 MED ORDER — LEVOCETIRIZINE DIHYDROCHLORIDE 5 MG PO TABS: 5.0000 mg | ORAL_TABLET | Freq: Every evening | ORAL | Status: DC

## 2013-04-28 MED ORDER — LOSARTAN POTASSIUM 50 MG PO TABS
50.0000 mg | ORAL_TABLET | Freq: Every day | ORAL | Status: DC
  Administered 2013-04-29 – 2013-04-30 (×2): 50 mg via ORAL
  Filled 2013-04-28 (×2): qty 1

## 2013-04-28 MED ORDER — VANCOMYCIN HCL 1000 MG IV SOLR: 1000.0000 mg | Freq: Two times a day (BID) | INTRAVENOUS | Status: DC

## 2013-04-28 MED ORDER — OXYCODONE HCL 5 MG PO TABS
5.0000 mg | ORAL_TABLET | Freq: Once | ORAL | Status: AC | PRN
  Administered 2013-04-28: 5 mg via ORAL

## 2013-04-28 MED ORDER — DEXAMETHASONE SODIUM PHOSPHATE 4 MG/ML IJ SOLN
4.0000 mg | Freq: Four times a day (QID) | INTRAMUSCULAR | Status: DC
  Administered 2013-04-28: 4 mg via INTRAVENOUS
  Filled 2013-04-28 (×9): qty 1

## 2013-04-28 MED ORDER — SIMVASTATIN 10 MG PO TABS
10.0000 mg | ORAL_TABLET | Freq: Every day | ORAL | Status: DC
  Administered 2013-04-28 – 2013-04-29 (×2): 10 mg via ORAL
  Filled 2013-04-28 (×3): qty 1

## 2013-04-28 MED ORDER — OXYCODONE HCL 5 MG/5ML PO SOLN: 5.0000 mg | Freq: Once | ORAL | Status: AC | PRN

## 2013-04-28 MED ORDER — MIDAZOLAM HCL 2 MG/2ML IJ SOLN
INTRAMUSCULAR | Status: AC
  Filled 2013-04-28: qty 2

## 2013-04-28 MED ORDER — DEXAMETHASONE 4 MG PO TABS
4.0000 mg | ORAL_TABLET | Freq: Four times a day (QID) | ORAL | Status: DC
  Administered 2013-04-29 – 2013-04-30 (×6): 4 mg via ORAL
  Filled 2013-04-28 (×9): qty 1

## 2013-04-28 MED ORDER — LORATADINE 10 MG PO TABS
10.0000 mg | ORAL_TABLET | Freq: Every day | ORAL | Status: DC
  Administered 2013-04-29 – 2013-04-30 (×2): 10 mg via ORAL
  Filled 2013-04-28 (×2): qty 1

## 2013-04-28 SURGICAL SUPPLY — 64 items
BANDAGE GAUZE ELAST BULKY 4 IN (GAUZE/BANDAGES/DRESSINGS) ×6 IMPLANT
BENZOIN TINCTURE PRP APPL 2/3 (GAUZE/BANDAGES/DRESSINGS) ×3 IMPLANT
BIT DRILL SM SPINE QC 12 (BIT) ×3 IMPLANT
BLADE ULTRA TIP 2M (BLADE) ×3 IMPLANT
BUR BARREL STRAIGHT FLUTE 4.0 (BURR) IMPLANT
BUR MATCHSTICK NEURO 3.0 LAGG (BURR) ×3 IMPLANT
CANISTER SUCT 3000ML (MISCELLANEOUS) ×3 IMPLANT
CLOSURE WOUND 1/2 X4 (GAUZE/BANDAGES/DRESSINGS) ×1
CONT SPEC 4OZ CLIKSEAL STRL BL (MISCELLANEOUS) ×3 IMPLANT
COVER MAYO STAND STRL (DRAPES) ×3 IMPLANT
DRAPE C-ARM 42X72 X-RAY (DRAPES) ×6 IMPLANT
DRAPE LAPAROTOMY 100X72 PEDS (DRAPES) ×3 IMPLANT
DRAPE MICROSCOPE LEICA (MISCELLANEOUS) ×3 IMPLANT
DRAPE POUCH INSTRU U-SHP 10X18 (DRAPES) ×3 IMPLANT
DRAPE PROXIMA HALF (DRAPES) IMPLANT
DURAPREP 6ML APPLICATOR 50/CS (WOUND CARE) ×3 IMPLANT
ELECT REM PT RETURN 9FT ADLT (ELECTROSURGICAL) ×3
ELECTRODE REM PT RTRN 9FT ADLT (ELECTROSURGICAL) ×1 IMPLANT
EVACUATOR SILICONE 100CC (DRAIN) ×3 IMPLANT
GAUZE SPONGE 4X4 16PLY XRAY LF (GAUZE/BANDAGES/DRESSINGS) IMPLANT
GLOVE BIO SURGEON STRL SZ 6.5 (GLOVE) ×2 IMPLANT
GLOVE BIO SURGEONS STRL SZ 6.5 (GLOVE) ×1
GLOVE BIOGEL M 8.0 STRL (GLOVE) ×6 IMPLANT
GLOVE BIOGEL PI IND STRL 6.5 (GLOVE) ×1 IMPLANT
GLOVE BIOGEL PI IND STRL 8 (GLOVE) ×1 IMPLANT
GLOVE BIOGEL PI INDICATOR 6.5 (GLOVE) ×2
GLOVE BIOGEL PI INDICATOR 8 (GLOVE) ×2
GLOVE ECLIPSE 6.5 STRL STRAW (GLOVE) ×3 IMPLANT
GLOVE ECLIPSE 7.5 STRL STRAW (GLOVE) ×3 IMPLANT
GLOVE EXAM NITRILE LRG STRL (GLOVE) ×6 IMPLANT
GLOVE EXAM NITRILE MD LF STRL (GLOVE) IMPLANT
GLOVE EXAM NITRILE XL STR (GLOVE) IMPLANT
GLOVE EXAM NITRILE XS STR PU (GLOVE) IMPLANT
GOWN BRE IMP SLV AUR LG STRL (GOWN DISPOSABLE) IMPLANT
GOWN BRE IMP SLV AUR XL STRL (GOWN DISPOSABLE) IMPLANT
GOWN STRL REIN 2XL LVL4 (GOWN DISPOSABLE) IMPLANT
GOWN STRL REUS W/ TWL LRG LVL3 (GOWN DISPOSABLE) ×2 IMPLANT
GOWN STRL REUS W/TWL 2XL LVL3 (GOWN DISPOSABLE) ×3 IMPLANT
GOWN STRL REUS W/TWL LRG LVL3 (GOWN DISPOSABLE) ×4
HEAD HALTER (SOFTGOODS) ×3 IMPLANT
HEMOSTAT POWDER KIT SURGIFOAM (HEMOSTASIS) IMPLANT
KIT BASIN OR (CUSTOM PROCEDURE TRAY) ×3 IMPLANT
KIT ROOM TURNOVER OR (KITS) ×3 IMPLANT
NEEDLE SPNL 22GX3.5 QUINCKE BK (NEEDLE) ×3 IMPLANT
NS IRRIG 1000ML POUR BTL (IV SOLUTION) ×3 IMPLANT
PACK LAMINECTOMY NEURO (CUSTOM PROCEDURE TRAY) ×3 IMPLANT
PATTIES SURGICAL .5 X1 (DISPOSABLE) ×3 IMPLANT
PLATE ANT CERV XTEND 4 LV 66 (Plate) ×3 IMPLANT
PUTTY DBX 1CC (Putty) ×3 IMPLANT
PUTTY DBX 1CC DEPUY (Putty) ×1 IMPLANT
RUBBERBAND STERILE (MISCELLANEOUS) ×6 IMPLANT
SCREW SELF TAP VARIABLE 4.6X12 (Screw) ×6 IMPLANT
SCREW XTD VAR 4.2 SELF TAP 12 (Screw) ×24 IMPLANT
SPACER ACDF SM LORDOTIC 7 (Spacer) ×9 IMPLANT
SPACER COLONIAL SZ 6-7 (Spacer) ×3 IMPLANT
SPONGE GAUZE 4X4 12PLY (GAUZE/BANDAGES/DRESSINGS) ×3 IMPLANT
SPONGE INTESTINAL PEANUT (DISPOSABLE) ×6 IMPLANT
SPONGE SURGIFOAM ABS GEL 100 (HEMOSTASIS) ×3 IMPLANT
STRIP CLOSURE SKIN 1/2X4 (GAUZE/BANDAGES/DRESSINGS) ×2 IMPLANT
SUT VIC AB 3-0 SH 8-18 (SUTURE) ×3 IMPLANT
SYR 20ML ECCENTRIC (SYRINGE) ×3 IMPLANT
TOWEL OR 17X24 6PK STRL BLUE (TOWEL DISPOSABLE) ×3 IMPLANT
TOWEL OR 17X26 10 PK STRL BLUE (TOWEL DISPOSABLE) ×3 IMPLANT
WATER STERILE IRR 1000ML POUR (IV SOLUTION) ×3 IMPLANT

## 2013-04-28 NOTE — Progress Notes (Signed)
Op note [phone]

## 2013-04-28 NOTE — Transfer of Care (Signed)
Immediate Anesthesia Transfer of Care Note  Patient: Shelby Bowman  Procedure(s) Performed: Procedure(s): CERVICAL THREE TO FOUR, CERVICAL FOUR TO FIVE, CERVICAL FIVE TO SIX, CERVICAL SIX TO SEVEN ANTERIOR CERVICAL DECOMPRESSION/DISCECTOMY FUSION 4 LEVELS (N/A)  Patient Location: PACU  Anesthesia Type:General  Level of Consciousness: awake, alert  and oriented  Airway & Oxygen Therapy: Patient Spontanous Breathing and Patient connected to nasal cannula oxygen  Post-op Assessment: Report given to PACU RN, Post -op Vital signs reviewed and stable and Patient moving all extremities X 4  Post vital signs: Reviewed and stable  Complications: No apparent anesthesia complications

## 2013-04-28 NOTE — Anesthesia Preprocedure Evaluation (Signed)
Anesthesia Evaluation  Patient identified by MRN, date of birth, ID band Patient awake    Reviewed: Allergy & Precautions, H&P , NPO status , Patient's Chart, lab work & pertinent test results  Airway Mallampati: II  Neck ROM: full    Dental   Pulmonary shortness of breath, asthma , former smoker,          Cardiovascular hypertension,     Neuro/Psych Anxiety    GI/Hepatic GERD-  ,  Endo/Other  Hypothyroidism   Renal/GU      Musculoskeletal  (+) Arthritis -,   Abdominal   Peds  Hematology   Anesthesia Other Findings   Reproductive/Obstetrics                           Anesthesia Physical Anesthesia Plan  ASA: II  Anesthesia Plan: General   Post-op Pain Management:    Induction: Intravenous  Airway Management Planned: Oral ETT  Additional Equipment:   Intra-op Plan:   Post-operative Plan: Extubation in OR  Informed Consent: I have reviewed the patients History and Physical, chart, labs and discussed the procedure including the risks, benefits and alternatives for the proposed anesthesia with the patient or authorized representative who has indicated his/her understanding and acceptance.     Plan Discussed with: CRNA, Anesthesiologist and Surgeon  Anesthesia Plan Comments:         Anesthesia Quick Evaluation

## 2013-04-28 NOTE — Anesthesia Postprocedure Evaluation (Signed)
Anesthesia Post Note  Patient: Shelby Bowman  Procedure(s) Performed: Procedure(s) (LRB): CERVICAL THREE TO FOUR, CERVICAL FOUR TO FIVE, CERVICAL FIVE TO SIX, CERVICAL SIX TO SEVEN ANTERIOR CERVICAL DECOMPRESSION/DISCECTOMY FUSION 4 LEVELS (N/A)  Anesthesia type: general  Patient location: PACU  Post pain: Pain level controlled  Post assessment: Patient's Cardiovascular Status Stable  Last Vitals:  Filed Vitals:   04/28/13 1900  BP: 144/64  Pulse: 63  Temp:   Resp: 12    Post vital signs: Reviewed and stable  Level of consciousness: sedated  Complications: No apparent anesthesia complications

## 2013-04-28 NOTE — Preoperative (Signed)
Beta Blockers   Reason not to administer Beta Blockers:Not Applicable 

## 2013-04-29 NOTE — Evaluation (Signed)
Occupational Therapy Evaluation Patient Details Name: Shelby Bowman MRN: [id number] DOB: 1945-11-26 Today's Date: 04/29/2013 Time: 2951-8841 OT Time Calculation (min): 25 min  OT Assessment / Plan / Recommendation History of present illness 68 y.o. s/p CERVICAL THREE TO FOUR, CERVICAL FOUR TO FIVE, CERVICAL FIVE TO SIX, CERVICAL SIX TO SEVEN ANTERIOR CERVICAL DECOMPRESSION/DISCECTOMY FUSION 4 LEVELS (N/A)   Clinical Impression   Pt moving well during session. Education provided as well as handout. Feel pt is safe to d/c home, from OT standpoint, with spouse available to assist. No further OT needs at this time.     OT Assessment  Patient does not need any further OT services    Follow Up Recommendations  No OT follow up;Supervision - Intermittent    Barriers to Discharge      Equipment Recommendations  None recommended by OT    Recommendations for Other Services    Frequency       Precautions / Restrictions Precautions Precautions: Cervical Required Braces or Orthoses: Cervical Brace Cervical Brace: Soft collar   Pertinent Vitals/Pain Pain 2/10. Repositioned.    ADL  Upper Body Dressing: Set up;Supervision/safety Where Assessed - Upper Body Dressing: Unsupported sitting Lower Body Dressing: Minimal assistance (overall) Where Assessed - Lower Body Dressing: Unsupported sit to stand Toilet Transfer: Supervision/safety Toilet Transfer Method: Sit to Loss adjuster, chartered: Comfort height toilet;Grab bars Toileting - Water quality scientist and Hygiene: Supervision/safety Where Assessed - Best boy and Hygiene: Standing;Sit on 3-in-1 or toilet Tub/Shower Transfer: English as a second language teacher Method: Therapist, art: Walk in Engineer, site Used: Sock aid;Reacher;Long-handled sponge;Long-handled shoe horn Transfers/Ambulation Related to ADLs: Supervision ADL Comments: Practiced with AE for LB ADLs and OT  discussed use of elastic shoe laces if needed. Cues for precautions during session. Pt donned underwear and donned/doffed sock with AE with cues for precautions. Educated on use of cup for teeth care to avoid breaking precautions. Recommended spouse being with her for shower transfer.     OT Diagnosis:    OT Problem List:   OT Treatment Interventions:     OT Goals(Current goals can be found in the care plan section)    Visit Information  Last OT Received On: 04/29/13 Assistance Needed: +1 History of Present Illness: 68 y.o. s/p CERVICAL THREE TO FOUR, CERVICAL FOUR TO FIVE, CERVICAL FIVE TO SIX, CERVICAL SIX TO SEVEN ANTERIOR CERVICAL DECOMPRESSION/DISCECTOMY FUSION 4 LEVELS (N/A)       Prior Functioning     Home Living Family/patient expects to be discharged to:: Private residence Living Arrangements: Spouse/significant other Available Help at Discharge: Family;Available 24 hours/day Type of Home: House Home Access: Stairs to enter [employer] of Steps: 1 Entrance Stairs-Rails: None Home Layout: Two level;Able to live on main level with bedroom/bathroom Home Equipment: Adaptive equipment;Walker - 2 wheels;Shower seat - built in [employer] Equipment: Reacher;Sock aid;Long-handled shoe horn;Long-handled sponge (sockaid-cracked) Prior Function Level of Independence: Independent Communication Communication: No difficulties         Vision/Perception     Cognition  Cognition Arousal/Alertness: Awake/alert Behavior During Therapy: WFL for tasks assessed/performed Overall Cognitive Status: Within Functional Limits for tasks assessed    Extremity/Trunk Assessment Upper Extremity Assessment Upper Extremity Assessment: Overall WFL for tasks assessed     Mobility Bed Mobility Overal bed mobility: Needs Assistance Bed Mobility: Rolling;Sidelying to Sit;Sit to Sidelying Rolling: Supervision Sidelying to sit: Supervision General bed mobility comments: cues for  technique. Transfers Overall transfer level: Needs assistance Equipment used: None Transfers: Sit to/from Stand Sit  to Stand: Supervision     Exercise     Balance     End of Session OT - End of Session Equipment Utilized During Treatment: Cervical collar Activity Tolerance: Patient tolerated treatment well Patient left: in bed;with call bell/phone within reach  [place], [place] L OTR/L [phone] 04/29/2013, 10:09 AM

## 2013-04-29 NOTE — Progress Notes (Signed)
PT Cancellation Note  Patient Details Name: Shelby Bowman MRN: [id number] DOB: 1946/02/24   Cancelled Treatment:    Reason Eval/Treat Not Completed: OT screened, no needs identified, will sign off.  OT screened for PT needs. PT checked in with pt.  No PT needs.  All education completed by OT.  PT to sign off.    Thanks,  Shelby Bowman. Shelby Bowman, PT, DPT [phone]   04/29/2013, 10:36 AM

## 2013-04-29 NOTE — Op Note (Signed)
Shelby Bowman, Shelby Bowman NO.:  [id number]  MEDICAL RECORD NO.:  [id number]  LOCATION:  3C08C                        FACILITY:  [facility]  PHYSICIAN:  Leeroy Cha, M.D.   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  Degenerative disk disease.  Status post lumbar fusion.  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  Degenerative disk disease.  Status post lumbar fusion.  PROCEDURE:  Cervical 3-4, 4-5, 5-6, and 6-7 diskectomy, decompression of spinal cord, bilateral foraminotomy, interbody fusion with cages and plate from M0-N0, microscope.  SURGEON:  Leeroy Cha, M.D.  ASSISTANT:  Ashok Pall, M.D.  CLINICAL HISTORY:  [name] is a lady who underwent 4-level lumbar fusion.  The patient did well, but she continues to have neck pain we knew that before the first surgery.  We knew by x-ray that she has cervical stenosis with degenerative disk disease, and chronic foraminal narrow from C3-C7.  Now, she came to have the decompression of the neck.  PROCEDURE:  The patient was taken to the OR, and after intubation, the left side of the neck was cleaned with DuraPrep.  Drapes were applied. Longitudinal incision from C3 down to C6-C7 was made through the skin and subcutaneous tissue.  The dissection was carried out.  Indeed we found that she has quite a bit of anterior osteophyte and all of them were removed.  The first x-ray showed that we were right at the level of C3-C4.  With the help of the microscope, we opened the anterior C3-C4 space with the drill.  We removed the degenerative disk disease with removal of large osteophyte and decompression of the spinal cord after opening the posterior ligament.  Bilateral foraminotomy was accomplished.   The same finding was found at the level of 4-5 and 5-6 being the worst at the level of C6-C7 with a large osteophyte centrally. At the end, we had decompression not only at level 3-4 but also at 4-5, 5-6, and 6-7 for foramina decompression via nerve root.  The single worst nerve root was the one at the level of 6-7 in the right side.  At the end, we had plenty of space, and the endplate were drilled.  Cage of 6-mm height, lordotic with autograft and DBX was inserted at the level of C3-C4 and the rest 3 spaces we used 3 cages of 7, lordotic.  Lateral cervical spine showed good position of the cages.  Then, we introduced a plate from U7-O5 with a total of 10 screws.  Lateral cervical spine x-ray showed good position of the plate as well as the cages.  The area was irrigated.  Hemostasis was accomplished.  We left a drain in the epidural space and the wound was closed with Vicryl and Steri-Strips.          ______________________________ Leeroy Cha, M.D.     EB/MEDQ  D:  04/28/2013  T:  04/29/2013  Job:  [id number]

## 2013-04-29 NOTE — Progress Notes (Signed)
Patient ID: Shelby Bowman, female   DOB: May 06, 1945, 68 y.o.   MRN: [id number] Stable, out of bed. No weakness. Some drainage in the hemovac.

## 2013-04-29 NOTE — Progress Notes (Signed)
Utilization review completed.  

## 2013-04-30 ENCOUNTER — Encounter (HOSPITAL_COMMUNITY): Payer: Self-pay | Admitting: Neurosurgery

## 2013-04-30 NOTE — Discharge Summary (Signed)
Physician Discharge Summary  Patient ID: Shelby Bowman MRN: [id number] DOB/AGE: [birth year] 68 y.o.  Admit date: 04/28/2013 Discharge date: 04/30/2013  Admission Diagnoses:cervical stenosis  Discharge Diagnoses:  Active Problems:   Cervical stenosis of spinal canal   Discharged Condition: no pain  Hospital Course: surgery  Consults: none  Significant Diagnostic Studies:myelogram  Treatments: c3 to c7 fusion  Discharge Exam: Blood pressure 131/65, pulse 75, temperature 98.8 F (37.1 C), temperature source Oral, resp. rate 16, SpO2 95.00%. No weakness.  Disposition: home. To see me in 3 weeks or before prn     Medication List    ASK your doctor about these medications       albuterol (2.5 MG/3ML) 0.083% nebulizer solution  Commonly known as:  PROVENTIL  Take 2.5 mg by nebulization every 6 (six) hours as needed. For shortness of breath/wheezing     alendronate 70 MG tablet  Commonly known as:  FOSAMAX  - Take 70 mg by mouth every 7 (seven) days. Take with a full glass of water on an empty stomach.  - On Monday     DULoxetine 30 MG capsule  Commonly known as:  CYMBALTA  Take 30 mg by mouth daily.     fluticasone 50 MCG/ACT nasal spray  Commonly known as:  FLONASE  Place 2 sprays into the nose 2 (two) times daily.     hydrochlorothiazide 25 MG tablet  Commonly known as:  HYDRODIURIL  Take 25 mg by mouth daily.     HYDROcodone-acetaminophen 10-325 MG per tablet  Commonly known as:  NORCO  Take 1 tablet by mouth every 6 (six) hours as needed for moderate pain.     levocetirizine 5 MG tablet  Commonly known as:  XYZAL  Take 5 mg by mouth every evening.     levothyroxine 75 MCG tablet  Commonly known as:  SYNTHROID, LEVOTHROID  Take 75 mcg by mouth daily.     losartan-hydrochlorothiazide 50-12.5 MG per tablet  Commonly known as:  HYZAAR  Take 1 tablet by mouth daily.     mometasone-formoterol 200-5 MCG/ACT Aero  Commonly known as:  DULERA  Inhale 2  puffs into the lungs 2 (two) times daily.     montelukast 10 MG tablet  Commonly known as:  SINGULAIR  Take 10 mg by mouth at bedtime.     multivitamin with minerals Tabs tablet  Take 1 tablet by mouth daily.     nabumetone 750 MG tablet  Commonly known as:  RELAFEN  Take 750 mg by mouth 2 (two) times daily.     pantoprazole 40 MG tablet  Commonly known as:  PROTONIX  Take 40 mg by mouth daily.     pravastatin 20 MG tablet  Commonly known as:  PRAVACHOL  Take 20 mg by mouth every evening.     VIACTIV MULTI-VITAMIN PO  Take 2 capsules by mouth 2 (two) times daily.           Follow-up Information   Follow up with Faythe Ghee, MD.   Specialty:  Neurosurgery   Contact information:   [address] [place] [address] [phone]       Signed: Floyce Stakes 04/30/2013, 4:11 PM

## 2013-04-30 NOTE — Discharge Instructions (Signed)
Wound Care Leave incision open to air. You may shower. Do not scrub directly on incision.  Leave steri-strips on neck.  They will fall off themselves. Do not put any creams, lotions, or ointments on incision. Activity Walk each and every day, increasing distance each day. No lifting greater than 5 lbs.  Avoid excessive neck motion. No driving for 2 weeks; may ride as a passenger locally. Wear neck brace at all times except when showering.  If provided soft collar, may wear for comfort unless otherwise instructed. Diet Resume your normal diet.  Return to Work Will be discussed at you follow up appointment. Call Your Doctor If Any of These Occur Redness, drainage, or swelling at the wound.  Temperature greater than 101 degrees. Severe pain not relieved by pain medication. Increased difficulty swallowing. Incision starts to come apart. Follow Up Appt Call today for appointment in 3-4 weeks [phone]) or for problems.  If you have any hardware placed in your spine, you will need an x-ray before your appointment.Anterior Cervical Diskectomy and Fusion Anterior cervical diskectomy is surgery done on the upper spine to relieve pressure on one or more nerve roots, or on the spinal cord. There are 7 bones in your neck, called the cervical spine. These 7 bones (vertebrae) sit one on top of the other. Cushions (intervertebral disks) separate the vertebrae and act like shock absorbers. As we age, degeneration of our bones, joints, and disks can cause neck pain and tightening around the spinal cord and nerve roots. This causes arm pain and weakness.  Degeneration involves:  Herniated Disk. With age, the disks dry up and can rupture. In this condition, the center of the disk bulges out (disk herniation). This can cause pressure on a nerve, which produces pain or weakness in the arm.  Bone spurs and spinal stenosis. As we age, growths often develop on our bones. These growths are called bone spurs  (osteophytes). A bone spur is a collection of calcium. As bone spurs grow and extend, the vertebral openings become narrow. The spinal canal and/or the foramen (opening for nerve passageways) become smaller. This narrowing (stenosis) may cause pinching (compression) of the spinal cord or the spinal nerve root. The nerve injury can cause pain, weakness, numbness, and loss of coordination in the upper limbs. Often, patients have difficulty with their hand writing or they start dropping things, because their hand grip is weaker. The spinal cord damage can cause increased stiffness, more frequent falls, electric shooting pain, and changes in bowel and bladder control. Degeneration in the neck results in three common problems:  Radiculopathy - Nerve compression that results in weakness or pain that radiates down the arm.  Myelopathy - Spinal cord compression that causes stiffness, difficulty with walking, coordination, and trouble with bowel or bladder habits.  Neck pain - Worn out joints cause pain as the neck moves. Treatment:  Radiculopathy - Surgery is performed to remove the bony and disk material that is pushing on the nerve.  Myelopathy - Surgery is performed to remove the bony and disk material that pushes on the spinal cord.  Neck pain - Surgery is performed to combine (fuse) the joints of the neck together, so they cannot move or cause pain. Surgery can be done from the front or the back of the neck. When it is done from the front, it is called an anterior (front) cervical (neck) diskectomy (removal of the disk) and fusion. LET YOUR CAREGIVER KNOW ABOUT:   Recent infections.  Any shooting  pains down your leg, when you move your neck.  Any difficulty swallowing.  A smoking history.  Use of blood thinners or anti-inflammatory medicines.  Any history of injury to your shoulders.  Any history of injury to your vocal cords.  Any foreign objects in your body from a previous  surgery.  Any recent fevers or illness.  Past medical history (diabetes, strokes).  Past problems with anesthetics.  Possibility of pregnancy.  History of blood clots (deep vein thrombosis).  History of bleeding or blood problems.  Past surgeries.  Other health problems.  Allergies.  Medicines you take, including herbs, eye drops, over-the-counter medicines, and creams.  Use of steroids (by mouth or creams). RISKS AND COMPLICATIONS  Infection.  Bleeding.  Injury to the following structures:  Carotid artery. This can result in a stroke or significant amount of bleeding.  Esophagus, resulting in difficulty swallowing.  Recurrent laryngeal nerve, resulting in hoarseness of the voice.  Spinal cord injury, ranging from mild to complete quadriparesis (muscle weakness in all four limbs).  Nerve root injury, resulting in muscle weakness in the upper limb.  Leakage of cerebrospinal fluid. BEFORE THE PROCEDURE   You will be given medicine to help you sleep (general anesthetic), and a breathing tube will be placed.  You will be given antibiotics to keep the infection rate down.  The incision site on your neck will be marked.  Your neck will be cleaned, to reduce the risk of infection. PROCEDURE  An anterior cervical fusion means that the operation is done through the front (anterior) part of your neck. The cut made by the surgeon (incision) is usually within a skin fold line on the neck. After pushing aside the neck muscles, the surgeon removes the affected, degenerated disk and bone spurs (osteophytes), which takes the pressure off the nerves and spinal cord. This is called a decompression. The area where the disk was removed is then filled with a small piece of plastic. This plastic takes the place of the disk and keeps the nerve passageway (foramen) open and clear for the nerves. In most cases, the surgeon uses metal plates or pins (hardware) in the neck, to help stabilize  the level being fused. The hardware reduces motion at that level, so it can fuse. This provides extra support to the neck. A cervical fusion procedure takes anywhere from a couple to several hours, depending on the size of the neck, history of previous surgery, and number of levels being fused. AFTER THE PROCEDURE   You will likely spend 24 48 hours in the hospital. During this time, your caregivers will look for any signs of complications from the procedure.  Your caregiver will watch you, to make sure that fluid draining from the surgery slows down. It is important that a large mass of blood does not form in your neck, which would cause difficulty with breathing.  You will get 24 hours of antibiotics.  You can start to eat as soon as you feel comfortable. Once you have started eating, walking, urinating (voiding) and having bowel movements on your own, Anterior Cervical Diskectomy and Fusion Anterior cervical diskectomy is surgery done on the upper spine to relieve pressure on one or more nerve roots, or on the spinal cord. There are 7 bones in your neck, called the cervical spine. These 7 bones (vertebrae) sit one on top of the other. Cushions (intervertebral disks) separate the vertebrae and act like shock absorbers. As we age, degeneration of our bones, joints, and  disks can cause neck pain and tightening around the spinal cord and nerve roots. This causes arm pain and weakness.  Degeneration involves: Herniated Disk. With age, the disks dry up and can rupture. In this condition, the center of the disk bulges out (disk herniation). This can cause pressure on a nerve, which produces pain or weakness in the arm. Bone spurs and spinal stenosis. As we age, growths often develop on our bones. These growths are called bone spurs (osteophytes). A bone spur is a collection of calcium. As bone spurs grow and extend, the vertebral openings become narrow. The spinal canal and/or the foramen (opening for  nerve passageways) become smaller. This narrowing (stenosis) may cause pinching (compression) of the spinal cord or the spinal nerve root. The nerve injury can cause pain, weakness, numbness, and loss of coordination in the upper limbs. Often, patients have difficulty with their hand writing or they start dropping things, because their hand grip is weaker. The spinal cord damage can cause increased stiffness, more frequent falls, electric shooting pain, and changes in bowel and bladder control. Degeneration in the neck results in three common problems: Radiculopathy - Nerve compression that results in weakness or pain that radiates down the arm. Myelopathy - Spinal cord compression that causes stiffness, difficulty with walking, coordination, and trouble with bowel or bladder habits. Neck pain - Worn out joints cause pain as the neck moves. Treatment: Radiculopathy - Surgery is performed to remove the bony and disk material that is pushing on the nerve. Myelopathy - Surgery is performed to remove the bony and disk material that pushes on the spinal cord. Neck pain - Surgery is performed to combine (fuse) the joints of the neck together, so they cannot move or cause pain. Surgery can be done from the front or the back of the neck. When it is done from the front, it is called an anterior (front) cervical (neck) diskectomy (removal of the disk) and fusion. LET YOUR CAREGIVER KNOW ABOUT:  Recent infections. Any shooting pains down your leg, when you move your neck. Any difficulty swallowing. A smoking history. Use of blood thinners or anti-inflammatory medicines. Any history of injury to your shoulders. Any history of injury to your vocal cords. Any foreign objects in your body from a previous surgery. Any recent fevers or illness. Past medical history (diabetes, strokes). Past problems with anesthetics. Possibility of pregnancy. History of blood clots (deep vein thrombosis). History of bleeding  or blood problems. Past surgeries. Other health problems. Allergies. Medicines you take, including herbs, eye drops, over-the-counter medicines, and creams. Use of steroids (by mouth or creams). RISKS AND COMPLICATIONS Infection. Bleeding. Injury to the following structures: Carotid artery. This can result in a stroke or significant amount of bleeding. Esophagus, resulting in difficulty swallowing. Recurrent laryngeal nerve, resulting in hoarseness of the voice. Spinal cord injury, ranging from mild to complete quadriparesis (muscle weakness in all four limbs). Nerve root injury, resulting in muscle weakness in the upper limb. Leakage of cerebrospinal fluid. BEFORE THE PROCEDURE  You will be given medicine to help you sleep (general anesthetic), and a breathing tube will be placed. You will be given antibiotics to keep the infection rate down. The incision site on your neck will be marked. Your neck will be cleaned, to reduce the risk of infection. PROCEDURE  An anterior cervical fusion means that the operation is done through the front (anterior) part of your neck. The cut made by the surgeon (incision) is usually within a skin fold  line on the neck. After pushing aside the neck muscles, the surgeon removes the affected, degenerated disk and bone spurs (osteophytes), which takes the pressure off the nerves and spinal cord. This is called a decompression. The area where the disk was removed is then filled with a small piece of plastic. This plastic takes the place of the disk and keeps the nerve passageway (foramen) open and clear for the nerves. In most cases, the surgeon uses metal plates or pins (hardware) in the neck, to help stabilize the level being fused. The hardware reduces motion at that level, so it can fuse. This provides extra support to the neck. A cervical fusion procedure takes anywhere from a couple to several hours, depending on the size of the neck, history of previous  surgery, and number of levels being fused. AFTER THE PROCEDURE  You will likely spend 24 48 hours in the hospital. During this time, your caregivers will look for any signs of complications from the procedure. Your caregiver will watch you, to make sure that fluid draining from the surgery slows down. It is important that a large mass of blood does not form in your neck, which would cause difficulty with breathing. You will get 24 hours of antibiotics. You can start to eat as soon as you feel comfortable. Once you have started eating, walking, urinating (voiding) and having bowel movements on your own, your caregiver will discharge you home. HOME CARE INSTRUCTIONS  For 2 weeks, do not soak the incision site under water. Do not swim or take baths. Showers are okay, but rinse off the incision sites. Do not over exert yourself. Allow time for the incision to heal. It can take from 6 weeks to 6 months for fusion to take effect. Your caregiver may ask you to wear a neck collar during this time, as they check the fusion with multiple (serial) X-rays. Document Released: 02/13/2009 Document Revised: 06/22/2012 Document Reviewed: 02/13/2009 [facility] Patient Information 2014 [place], [place].  your caregiver will discharge you home. HOME CARE INSTRUCTIONS   For 2 weeks, do not soak the incision site under water. Do not swim or take baths. Showers are okay, but rinse off the incision sites.  Do not over exert yourself. Allow time for the incision to heal.  It can take from 6 weeks to 6 months for fusion to take effect. Your caregiver may ask you to wear a neck collar during this time, as they check the fusion with multiple (serial) X-rays. Document Released: 02/13/2009 Document Revised: 06/22/2012 Document Reviewed: 02/13/2009 Anterior Cervical Diskectomy and Fusion Anterior cervical diskectomy is surgery done on the upper spine to relieve pressure on one or more nerve roots, or on the spinal cord. There  are 7 bones in your neck, called the cervical spine. These 7 bones (vertebrae) sit one on top of the other. Cushions (intervertebral disks) separate the vertebrae and act like shock absorbers. As we age, degeneration of our bones, joints, and disks can cause neck pain and tightening around the spinal cord and nerve roots. This causes arm pain and weakness.  Degeneration involves:  Herniated Disk. With age, the disks dry up and can rupture. In this condition, the center of the disk bulges out (disk herniation). This can cause pressure on a nerve, which produces pain or weakness in the arm.  Bone spurs and spinal stenosis. As we age, growths often develop on our bones. These growths are called bone spurs (osteophytes). A bone spur is a collection of calcium.  As bone spurs grow and extend, the vertebral openings become narrow. The spinal canal and/or the foramen (opening for nerve passageways) become smaller. This narrowing (stenosis) may cause pinching (compression) of the spinal cord or the spinal nerve root. The nerve injury can cause pain, weakness, numbness, and loss of coordination in the upper limbs. Often, patients have difficulty with their hand writing or they start dropping things, because their hand grip is weaker. The spinal cord damage can cause increased stiffness, more frequent falls, electric shooting pain, and changes in bowel and bladder control. Degeneration in the neck results in three common problems:  Radiculopathy - Nerve compression that results in weakness or pain that radiates down the arm.  Myelopathy - Spinal cord compression that causes stiffness, difficulty with walking, coordination, and trouble with bowel or bladder habits.  Neck pain - Worn out joints cause pain as the neck moves. Treatment:  Radiculopathy - Surgery is performed to remove the bony and disk material that is pushing on the nerve.  Myelopathy - Surgery is performed to remove the bony and disk material  that pushes on the spinal cord.  Neck pain - Surgery is performed to combine (fuse) the joints of the neck together, so they cannot move or cause pain. Surgery can be done from the front or the back of the neck. When it is done from the front, it is called an anterior (front) cervical (neck) diskectomy (removal of the disk) and fusion. LET YOUR CAREGIVER KNOW ABOUT:   Recent infections.  Any shooting pains down your leg, when you move your neck.  Any difficulty swallowing.  A smoking history.  Use of blood thinners or anti-inflammatory medicines.  Any history of injury to your shoulders.  Any history of injury to your vocal cords.  Any foreign objects in your body from a previous surgery.  Any recent fevers or illness.  Past medical history (diabetes, strokes).  Past problems with anesthetics.  Possibility of pregnancy.  History of blood clots (deep vein thrombosis).  History of bleeding or blood problems.  Past surgeries.  Other health problems.  Allergies.  Medicines you take, including herbs, eye drops, over-the-counter medicines, and creams.  Use of steroids (by mouth or creams). RISKS AND COMPLICATIONS  Infection.  Bleeding.  Injury to the following structures:  Carotid artery. This can result in a stroke or significant amount of bleeding.  Esophagus, resulting in difficulty swallowing.  Recurrent laryngeal nerve, resulting in hoarseness of the voice.  Spinal cord injury, ranging from mild to complete quadriparesis (muscle weakness in all four limbs).  Nerve root injury, resulting in muscle weakness in the upper limb.  Leakage of cerebrospinal fluid. BEFORE THE PROCEDURE   You will be given medicine to help you sleep (general anesthetic), and a breathing tube will be placed.  You will be given antibiotics to keep the infection rate down.  The incision site on your neck will be marked.  Your neck will be cleaned, to reduce the risk of  infection. PROCEDURE  An anterior cervical fusion means that the operation is done through the front (anterior) part of your neck. The cut made by the surgeon (incision) is usually within a skin fold line on the neck. After pushing aside the neck muscles, the surgeon removes the affected, degenerated disk and bone spurs (osteophytes), which takes the pressure off the nerves and spinal cord. This is called a decompression. The area where the disk was removed is then filled with a small piece of plastic.  This plastic takes the place of the disk and keeps the nerve passageway (foramen) open and clear for the nerves. In most cases, the surgeon uses metal plates or pins (hardware) in the neck, to help stabilize the level being fused. The hardware reduces motion at that level, so it can fuse. This provides extra support to the neck. A cervical fusion procedure takes anywhere from a couple to several hours, depending on the size of the neck, history of previous surgery, and number of levels being fused. AFTER THE PROCEDURE   You will likely spend 24 48 hours in the hospital. During this time, your caregivers will look for any signs of complications from the procedure.  Your caregiver will watch you, to make sure that fluid draining from the surgery slows down. It is important that a large mass of blood does not form in your neck, which would cause difficulty with breathing.  You will get 24 hours of antibiotics.  You can start to eat as soon as you feel comfortable.  Once you have started eating, walking, urinating (voiding) and having bowel movements on your own, your caregiver will discharge you home. HOME CARE INSTRUCTIONS   For 2 weeks, do not soak the incision site under water. Do not swim or take baths. Showers are okay, but rinse off the incision sites.  Do not over exert yourself. Allow time for the incision to heal.  It can take from 6 weeks to 6 months for fusion to take effect. Your caregiver  may ask you to wear a neck collar during this time, as they check the fusion with multiple (serial) X-rays. Document Released: 02/13/2009 Document Revised: 06/22/2012 Document Reviewed: 02/13/2009 [facility] Patient Information 2014 [place], [place]. [employer] Patient Information 2014 [place], [place].

## 2014-04-01 IMAGING — CR DG LUMBAR SPINE 1V
1 series · 1 of 1 positions shown · non-contrast
Comparison: Portable film done earlier today.

CLINICAL DATA: Back pain

LUMBAR SPINE - 1 VIEW

[view not recorded]
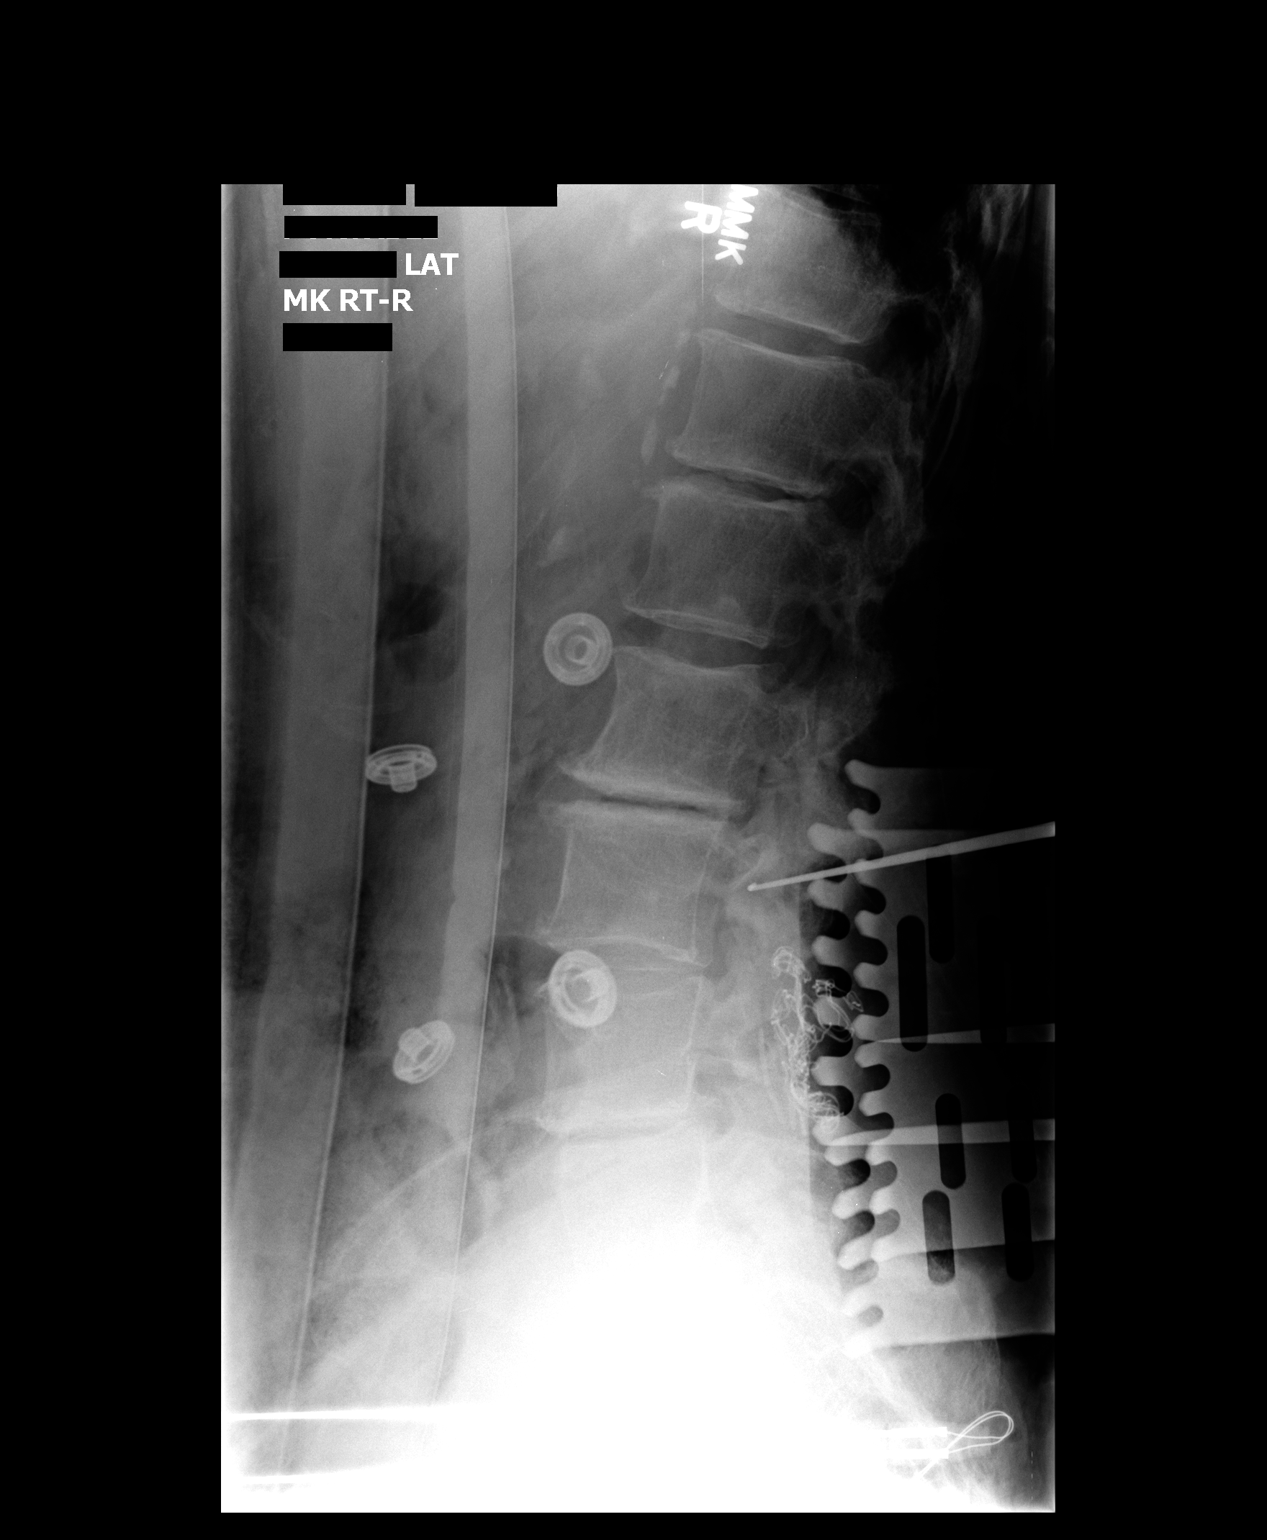

[1 of 1 positions shown; findings below may reference images not displayed]

FINDINGS: An angled probe has been inserted from a posterior
approach.  This lies posterior to the L3 vertebral body.}
IMPRESSION: As above.

## 2015-04-15 IMAGING — CR DG CHEST 2V
2 series · 2 of 2 positions shown · non-contrast
Comparison: No prior films available for comparison at this time.

CLINICAL DATA: Hypertension.

EXAM:
CHEST  2 VIEW

[w chest pa]
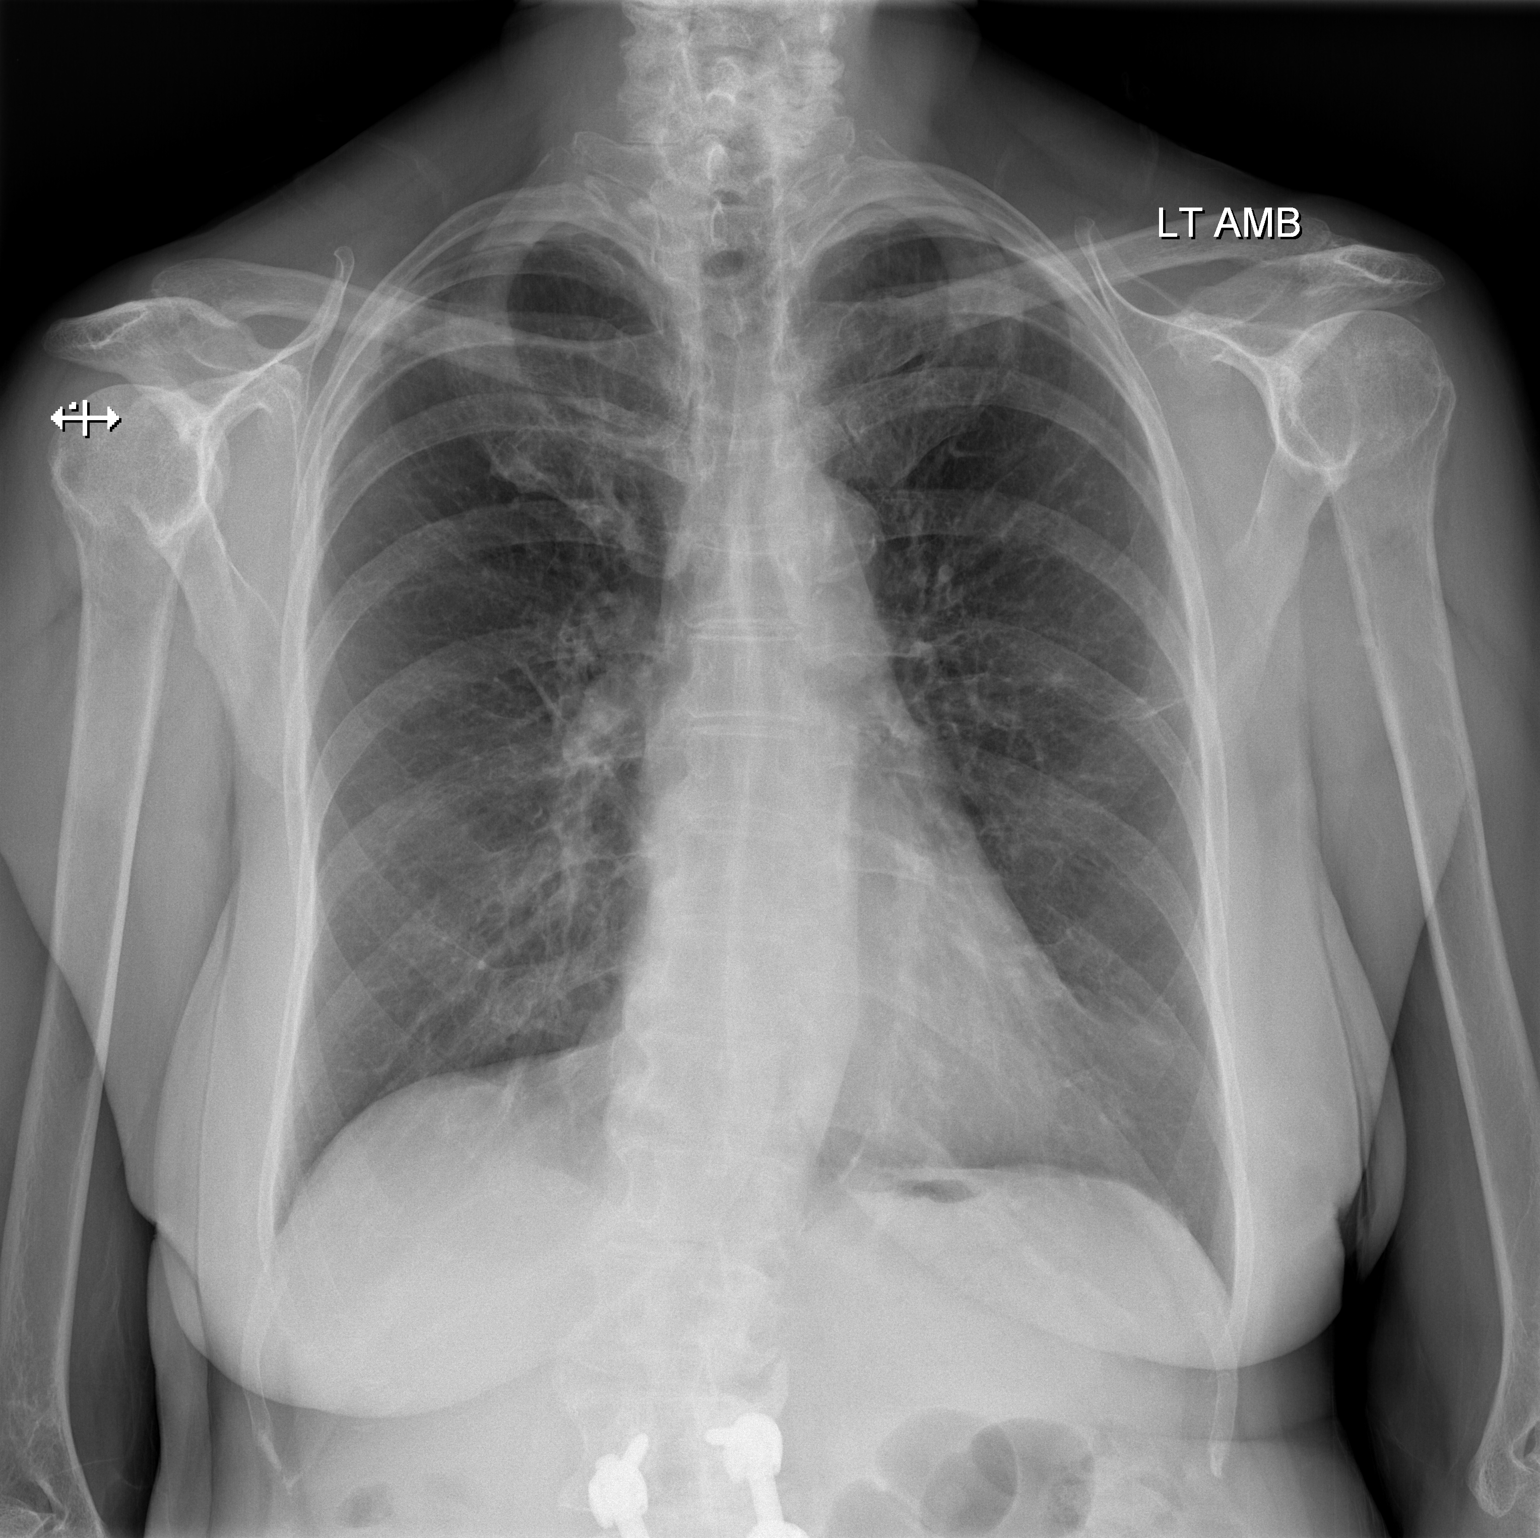

[w chest lat]
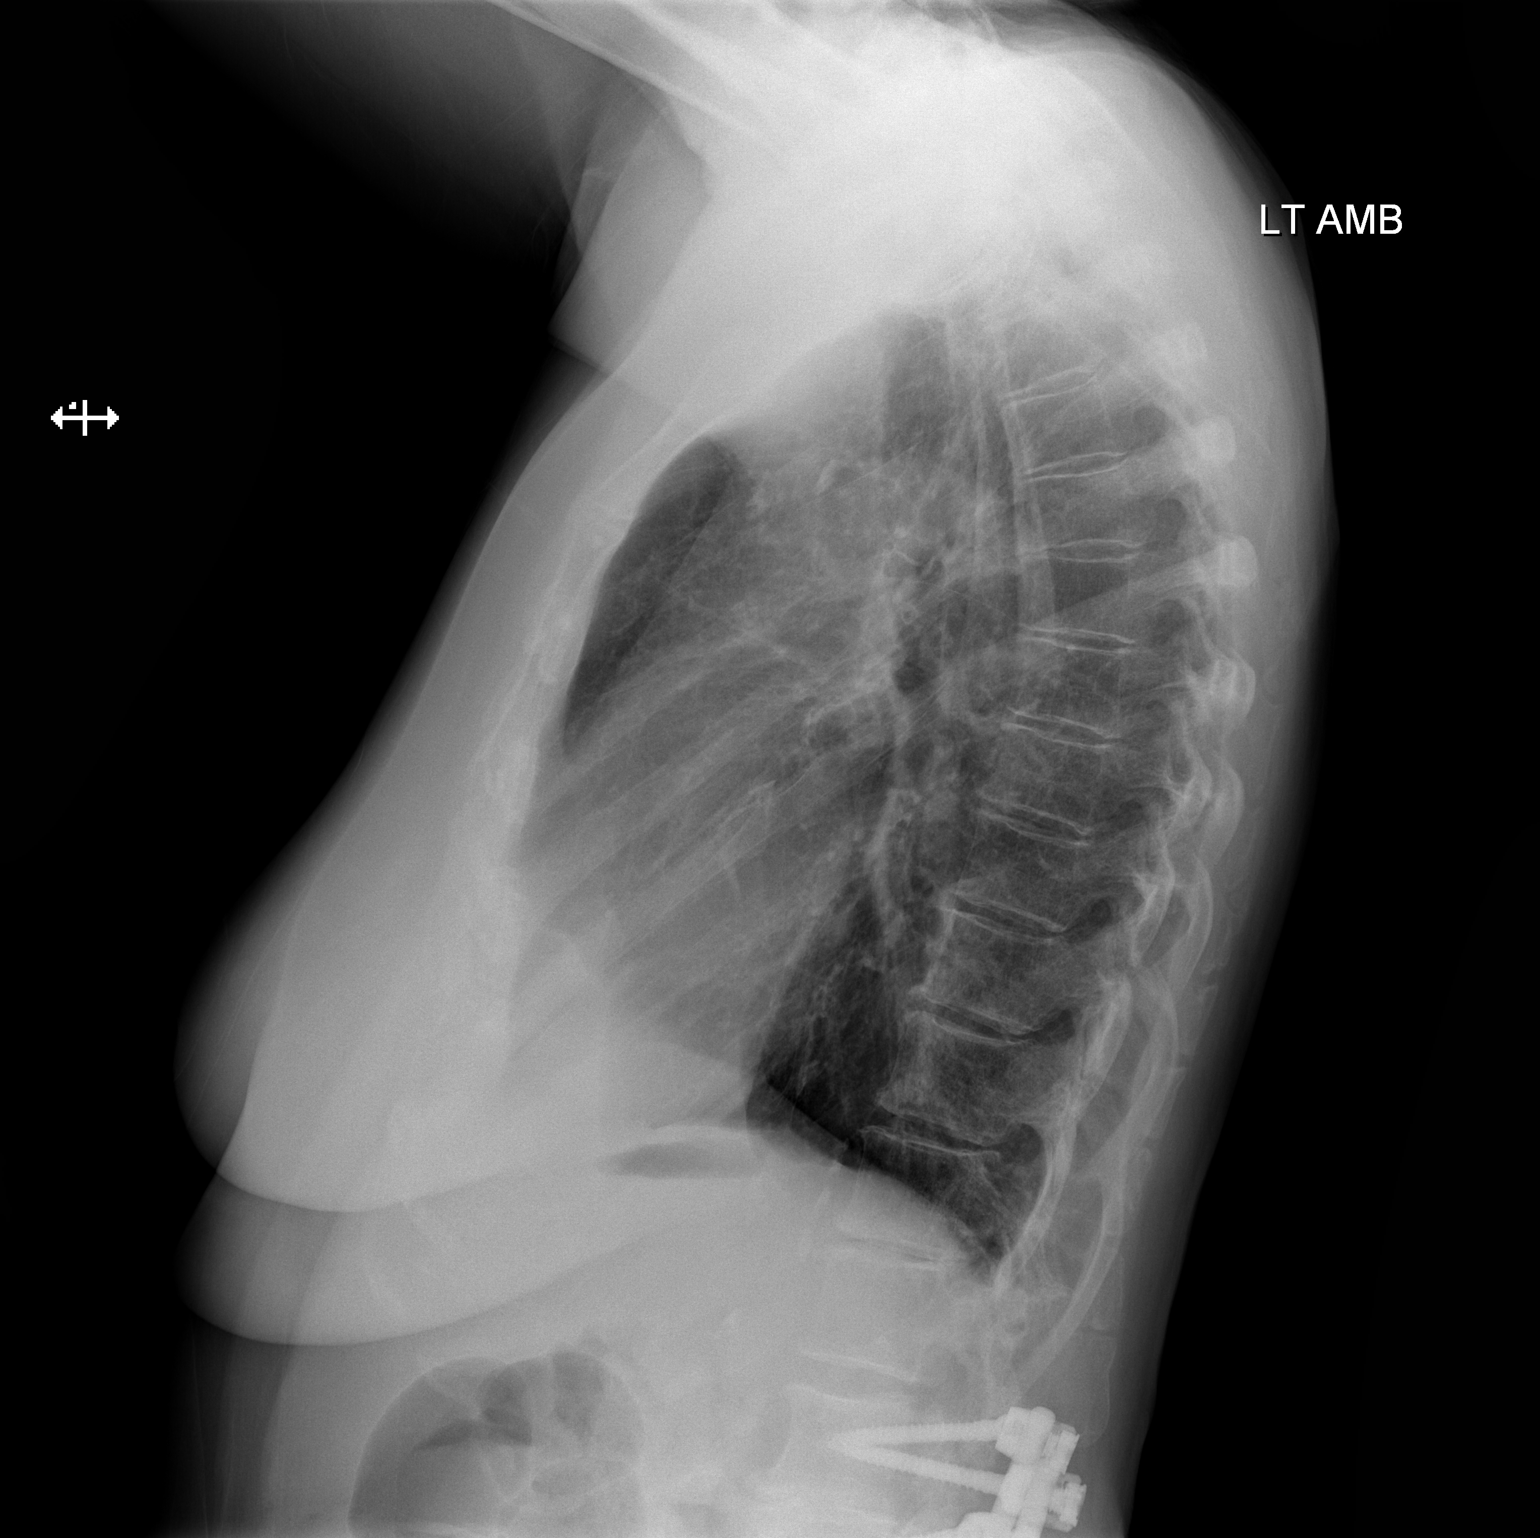

[2 of 2 positions shown; findings below may reference images not displayed]

FINDINGS: The heart size and mediastinal contours are within normal limits.
Minimal scarring is seen laterally in left midlung. No acute
pulmonary disease is noted. No pleural effusion or pneumothorax is
noted. The visualized skeletal structures are unremarkable.
IMPRESSION: No active cardiopulmonary disease.

## 2015-04-22 IMAGING — RF DG C-ARM 61-120 MIN
1 series · 1 of 1 positions shown · non-contrast
Comparison: none

CLINICAL DATA: C3 through C7 ACDF.

EXAM:
DG C-ARM 1-60 MIN; DG CERVICAL SPINE - 1 VIEW

[Series 1: run · 1 of 1 slices shown]
[im 1/1]
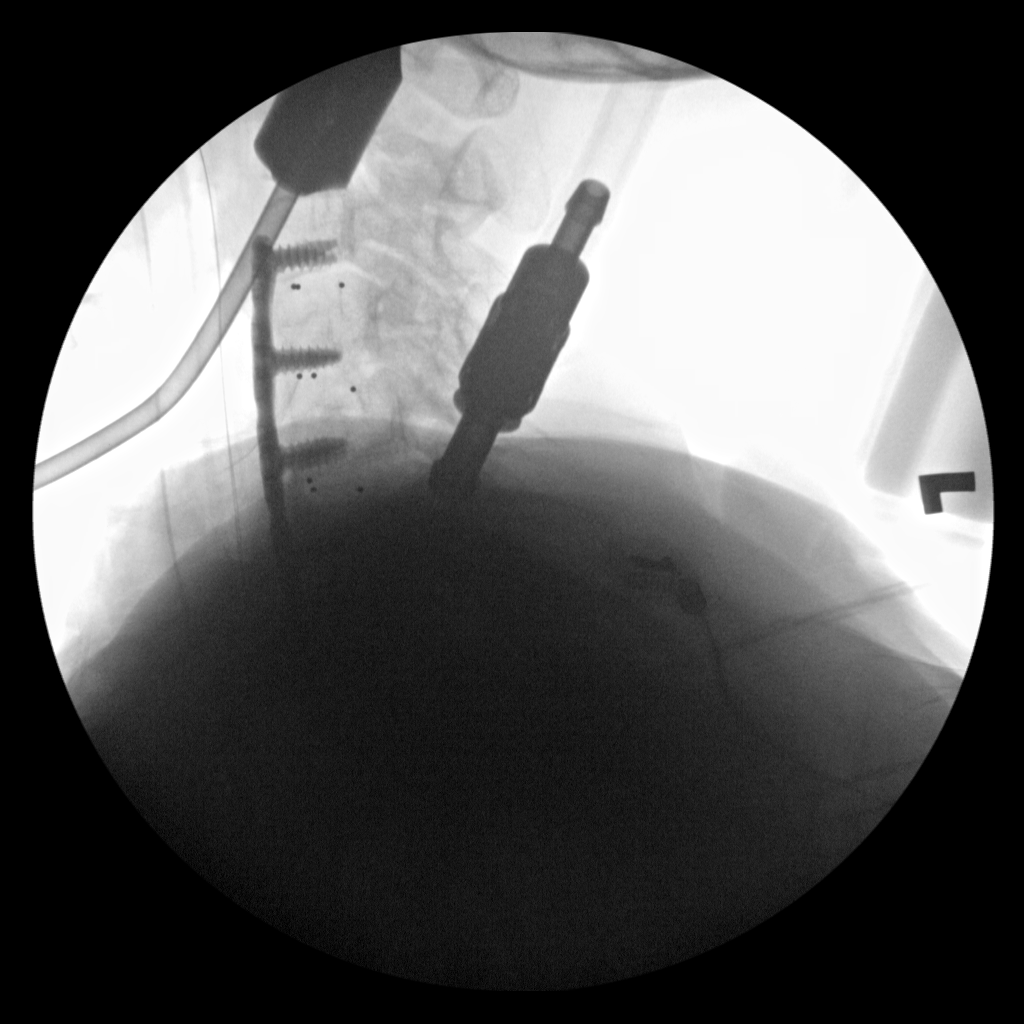

[1 of 1 positions shown; findings below may reference images not displayed]

FINDINGS: Single lateral C-arm image of the cervical spine. Surgical
instruments overlie the cervical spine.

ACDF with anterior plate and screws. Interbody spacers are present.
Fusion plate extends from C3 through C6. C7 not adequately
visualized on this image.
IMPRESSION: Satisfactory position of anterior plate and interbody bone graft at
C3-4, C4-5, C5-6. C6-7 not adequately visualized on this single
image.

## 2015-04-22 IMAGING — DX DG CERVICAL SPINE 1V
1 series · 1 of 1 positions shown · non-contrast
Comparison: MRI of the cervical spine 03/24/2013

CLINICAL DATA: C3 to C7 ACDF for stenosis.

EXAM:
DG CERVICAL SPINE - 1 VIEW

[lat]
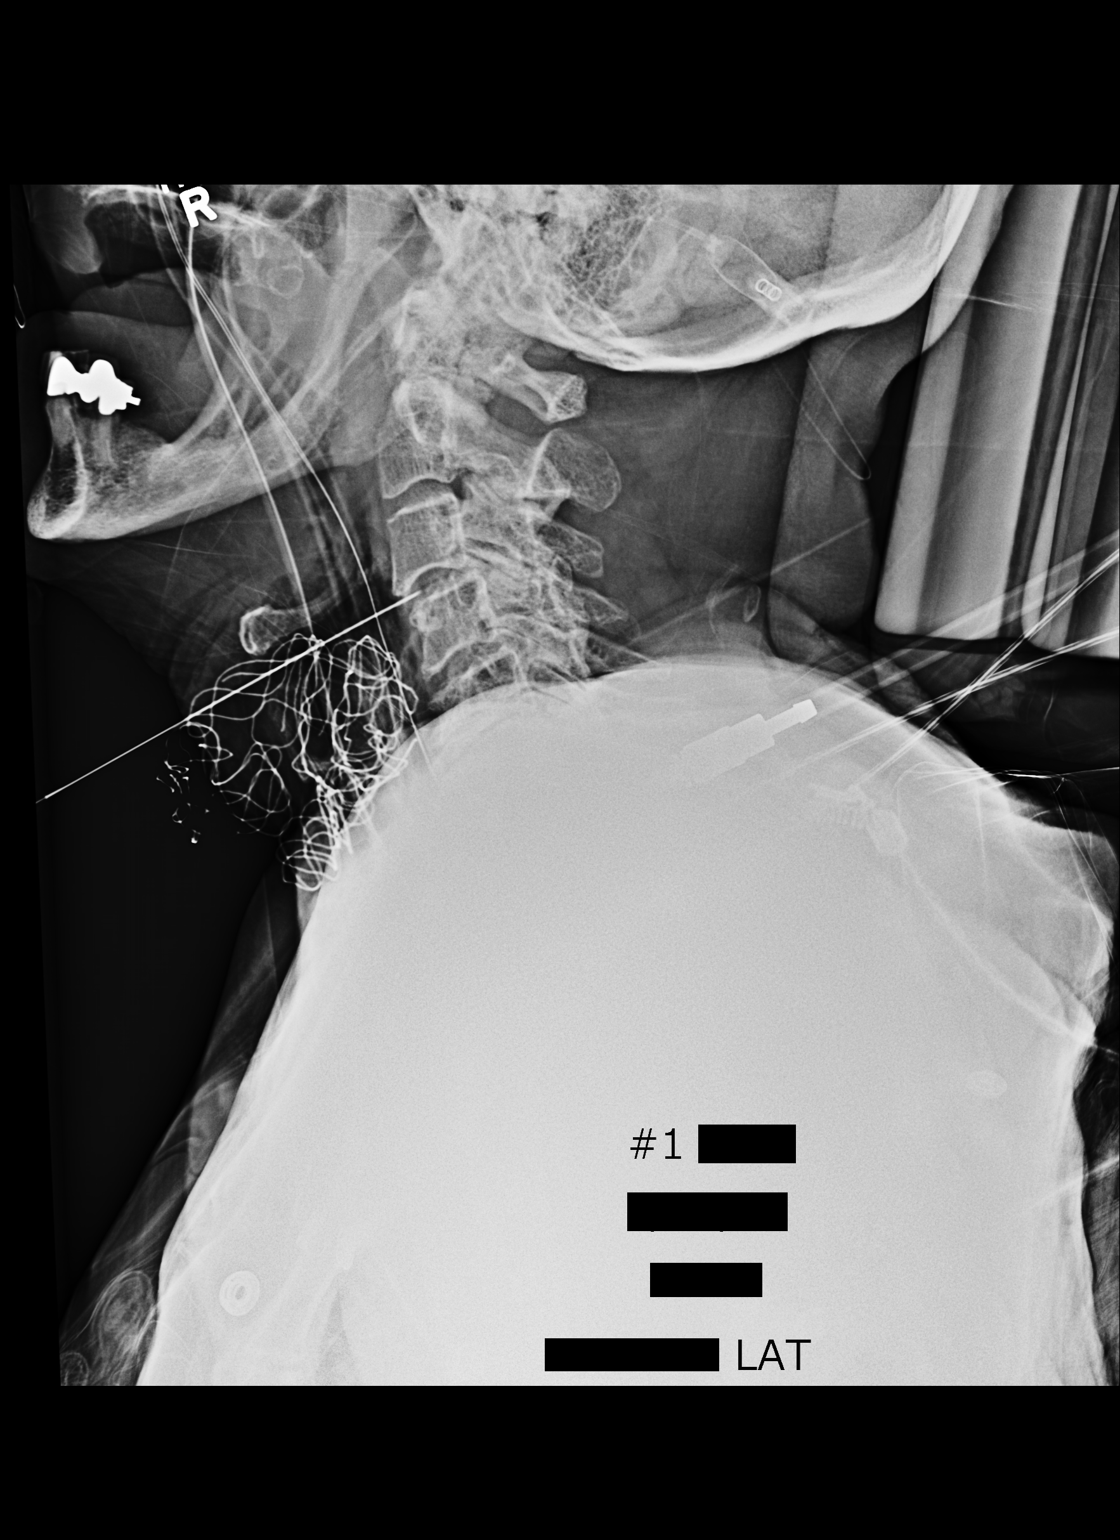

[1 of 1 positions shown; findings below may reference images not displayed]

FINDINGS: Intra operative lateral cervical spine radiograph shows needle
localization at the C3-4 disc. Orogastric and endotracheal tubes
noted. Surgical packing.
IMPRESSION: Intraoperative needle localization at C3-4.

## 2015-12-19 ENCOUNTER — Encounter: Payer: Self-pay | Admitting: Gastroenterology
